# Patient Record
Sex: Male | Born: 1965
Health system: Southern US, Community
[De-identification: ages and names within clinical notes are randomized; demographics above are authoritative.]

## PROBLEM LIST (undated history)

## (undated) DIAGNOSIS — R0789 Other chest pain: Secondary | ICD-10-CM

## (undated) DIAGNOSIS — F419 Anxiety disorder, unspecified: Secondary | ICD-10-CM

## (undated) DIAGNOSIS — N200 Calculus of kidney: Secondary | ICD-10-CM

## (undated) DIAGNOSIS — R7989 Other specified abnormal findings of blood chemistry: Secondary | ICD-10-CM

## (undated) DIAGNOSIS — R05 Cough: Secondary | ICD-10-CM

## (undated) HISTORY — DX: Calculus of kidney: N20.0

## (undated) HISTORY — PX: COLONOSCOPY: SHX174

## (undated) HISTORY — DX: Other specified abnormal findings of blood chemistry: R79.89

## (undated) HISTORY — DX: Other chest pain: R07.89

## (undated) HISTORY — DX: Anxiety disorder, unspecified: F41.9

---
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
## 2010-12-04 ENCOUNTER — Emergency Department (INDEPENDENT_AMBULATORY_CARE_PROVIDER_SITE_OTHER): Payer: BC Managed Care – PPO

## 2010-12-04 ENCOUNTER — Emergency Department (HOSPITAL_BASED_OUTPATIENT_CLINIC_OR_DEPARTMENT_OTHER)
Admission: EM | Admit: 2010-12-04 | Discharge: 2010-12-04 | Disposition: A | Discharge status: Home / Self Care | Payer: BC Managed Care – PPO | Attending: Emergency Medicine | Admitting: Emergency Medicine

## 2010-12-04 DIAGNOSIS — W2209XA Striking against other stationary object, initial encounter: Secondary | ICD-10-CM | POA: Insufficient documentation

## 2010-12-04 DIAGNOSIS — S0003XA Contusion of scalp, initial encounter: Secondary | ICD-10-CM

## 2010-12-04 DIAGNOSIS — F17200 Nicotine dependence, unspecified, uncomplicated: Secondary | ICD-10-CM | POA: Insufficient documentation

## 2010-12-04 DIAGNOSIS — S0083XA Contusion of other part of head, initial encounter: Secondary | ICD-10-CM | POA: Insufficient documentation

## 2017-05-30 DIAGNOSIS — N200 Calculus of kidney: Secondary | ICD-10-CM

## 2017-05-30 HISTORY — DX: Calculus of kidney: N20.0

## 2017-06-07 ENCOUNTER — Ambulatory Visit: Payer: BLUE CROSS/BLUE SHIELD | Admitting: Family Medicine

## 2017-06-07 ENCOUNTER — Encounter: Payer: Self-pay | Admitting: Family Medicine

## 2017-06-07 ENCOUNTER — Ambulatory Visit (HOSPITAL_BASED_OUTPATIENT_CLINIC_OR_DEPARTMENT_OTHER)
Admission: RE | Admit: 2017-06-07 | Discharge: 2017-06-07 | Disposition: A | Discharge status: Home / Self Care | Payer: BLUE CROSS/BLUE SHIELD | Source: Ambulatory Visit | Attending: Family Medicine | Admitting: Family Medicine

## 2017-06-07 ENCOUNTER — Other Ambulatory Visit: Payer: Self-pay

## 2017-06-07 VITALS — BP 123/76 | HR 65 | Temp 98.0°F | Resp 16 | Ht 73.5 in | Wt 214.0 lb

## 2017-06-07 DIAGNOSIS — Z87891 Personal history of nicotine dependence: Secondary | ICD-10-CM | POA: Insufficient documentation

## 2017-06-07 DIAGNOSIS — R059 Cough, unspecified: Secondary | ICD-10-CM

## 2017-06-07 DIAGNOSIS — R079 Chest pain, unspecified: Secondary | ICD-10-CM

## 2017-06-07 DIAGNOSIS — R05 Cough: Secondary | ICD-10-CM

## 2017-06-07 NOTE — Progress Notes (Signed)
Office Note 06/07/2017  CC:  Chief Complaint  Patient presents with  . Establish Care  . Scalp Itching  . Right Lung    "feels like something is in my right lung"    HPI:  Andrew Cantu is a 51 y.o.  male who is here to establish care and discuss scalp itching as well as a "lung" complaint. Patient's most recent primary MD: Dr. Margarita Grizzle at [facility].  Retired 2 yrs ago. Old records were not reviewed prior to or during today's visit.  Pt states he feels like there is something in the entrance of his right lung.  He has noticed this feeling constantly x 2 wks. No pain.  NO cough.  Says it feels deep, not on surface.  NO SOB or DOE.   When taking deep breaths like when he goes up stairs he notes a bit more out of breath easier than in the past. No hx of asthma or emphysema.  No fevers.  Some mucous comes up into back of throat when he coughs forcefully. No hemoptysis.  No abnl weight loss.  No recent URI sx's.  Swallowing does not affect this symptom.  No hoarse voice. Traveled to [place] and [place] in September.  Feels excessive stress at work lately, says it drives him to scratch his head. Is on leave from work lately, at home "waiting".    History reviewed. No pertinent past medical history.  History reviewed. No pertinent surgical history.  Family History  Problem Relation Age of Onset  . Hypertension Father     Social History   Socioeconomic History  . Marital status: Married    Spouse name: Not on file  . Number of children: Not on file  . Years of education: Not on file  . Highest education level: Not on file  Social Needs  . Financial resource strain: Not on file  . Food insecurity - worry: Not on file  . Food insecurity - inability: Not on file  . Transportation needs - medical: Not on file  . Transportation needs - non-medical: Not on file  Occupational History  . Not on file  Tobacco Use  . Smoking status: Former Smoker    Packs/day: 0.50    Years:  30.00    Pack years: 15.00    Types: Cigarettes    Last attempt to quit: 02/27/2017    Years since quitting: 0.2  . Smokeless tobacco: Never Used  Substance and Sexual Activity  . Alcohol use: Yes    Alcohol/week: 1.8 - 2.4 oz    Types: 3 - 4 Glasses of wine per week  . Drug use: No  . Sexual activity: Not on file  Other Topics Concern  . Not on file  Social History Narrative   [name] daughter (adult).   Educ: Masters degree (BA--[employer] and also in Engineer, production).   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   Tob: 30 pack year hx.  Quit 2018.   Alc: occ wine   Orig from [place].    MEDS: none  No Known Allergies  ROS Review of Systems  Constitutional: Negative for appetite change, chills, fatigue and fever.  HENT: Negative for congestion, dental problem, ear pain and sore throat.   Eyes: Negative for discharge, redness and visual disturbance.  Respiratory: Negative for cough, chest tightness, shortness of breath and wheezing.   Cardiovascular: Negative for chest pain, palpitations and leg swelling.  Gastrointestinal: Negative for abdominal pain, blood in stool, diarrhea, nausea and vomiting.  Genitourinary: Negative for difficulty urinating, dysuria, flank pain, frequency, hematuria and urgency.  Musculoskeletal: Negative for arthralgias, back pain, joint swelling, myalgias and neck stiffness.  Skin: Negative for pallor and rash.  Neurological: Negative for dizziness, speech difficulty, weakness and headaches.  Hematological: Negative for adenopathy. Does not bruise/bleed easily.  Psychiatric/Behavioral: Negative for confusion and sleep disturbance. The patient is nervous/anxious (lots of stress at work recently.).     PE; Blood pressure 123/76, pulse 65, temperature 98 F (36.7 C), temperature source Oral, resp. rate 16, height 6' 1.5" (1.867 m), weight 214 lb (97.1 kg), SpO2 94 %. Gen: Alert, well appearing.  Patient is oriented to person, place, time, and  situation. AFFECT: pleasant, lucid thought and speech. Head: scalp and hair are normal MWN:UUVO: no injection, icteris, swelling, or exudate.  EOMI, PERRLA. Mouth: lips without lesion/swelling.  Oral mucosa pink and moist. Oropharynx without erythema, exudate, or swelling.  CV: RRR, no m/r/g.   LUNGS: CTA bilat, nonlabored resps, good aeration in all lung fields. No chest wall deformity or tenderness or rash. No c/c/e.  Pertinent labs:  none  ASSESSMENT AND PLAN:   New pt; will attempt to obtain prior PCP records.  1) Itchy scalp: he is scratching b/c of nervous habit and this is inducing scratch/itch cycle. 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  2) Right chest abnormal sensation: pt feels like something is "in the entrance to my lung"---exam normal. Pt is ex-smoker, just quit this year. Will do CBC w/diff, CMET, and CXR.  If all normal will take a watchful waiting approach.  An After Visit Summary was printed and given to the patient.  Return for set up fasting CPE in 1 mo.  Signed:  Crissie Sickles, MD           06/07/2017

## 2017-06-08 LAB — CBC WITH DIFFERENTIAL/PLATELET
BASOS ABS: 42 {cells}/uL (ref 0–200)
BASOS PCT: 0.9 %
EOS PCT: 1.3 %
Eosinophils Absolute: 61 cells/uL (ref 15–500)
HCT: 39.5 % (ref 38.5–50.0)
HEMOGLOBIN: 13.8 g/dL (ref 13.2–17.1)
Lymphs Abs: 1415 cells/uL (ref 850–3900)
MCH: 30.1 pg (ref 27.0–33.0)
MCHC: 34.9 g/dL (ref 32.0–36.0)
MCV: 86.2 fL (ref 80.0–100.0)
MPV: 10.5 fL (ref 7.5–12.5)
Monocytes Relative: 7.7 %
NEUTROS ABS: 2820 {cells}/uL (ref 1500–7800)
Neutrophils Relative %: 60 %
Platelets: 300 10*3/uL (ref 140–400)
RBC: 4.58 10*6/uL (ref 4.20–5.80)
RDW: 12.5 % (ref 11.0–15.0)
Total Lymphocyte: 30.1 %
WBC mixed population: 362 cells/uL (ref 200–950)
WBC: 4.7 10*3/uL (ref 3.8–10.8)

## 2017-06-08 LAB — COMPREHENSIVE METABOLIC PANEL
AG RATIO: 1.8 (calc) (ref 1.0–2.5)
ALT: 19 U/L (ref 9–46)
AST: 16 U/L (ref 10–40)
Albumin: 4.6 g/dL (ref 3.6–5.1)
Alkaline phosphatase (APISO): 58 U/L (ref 40–115)
BUN / CREAT RATIO: 9 (calc) (ref 6–22)
BUN: 16 mg/dL (ref 7–25)
CO2: 25 mmol/L (ref 20–32)
Calcium: 9.5 mg/dL (ref 8.6–10.3)
Chloride: 106 mmol/L (ref 98–110)
Creat: 1.88 mg/dL — ABNORMAL HIGH (ref 0.60–1.35)
GLUCOSE: 97 mg/dL (ref 65–99)
Globulin: 2.5 g/dL (calc) (ref 1.9–3.7)
POTASSIUM: 4.3 mmol/L (ref 3.5–5.3)
Sodium: 140 mmol/L (ref 135–146)
TOTAL PROTEIN: 7.1 g/dL (ref 6.1–8.1)
Total Bilirubin: 0.9 mg/dL (ref 0.2–1.2)

## 2017-06-08 LAB — SEDIMENTATION RATE: Sed Rate: 6 mm/h (ref 0–15)

## 2017-06-09 ENCOUNTER — Encounter: Payer: Self-pay | Admitting: Family Medicine

## 2017-06-09 ENCOUNTER — Other Ambulatory Visit: Payer: Self-pay | Admitting: Family Medicine

## 2017-06-09 DIAGNOSIS — R7989 Other specified abnormal findings of blood chemistry: Secondary | ICD-10-CM

## 2017-06-10 ENCOUNTER — Ambulatory Visit (HOSPITAL_BASED_OUTPATIENT_CLINIC_OR_DEPARTMENT_OTHER)
Admission: RE | Admit: 2017-06-10 | Discharge: 2017-06-10 | Disposition: A | Discharge status: Home / Self Care | Payer: BLUE CROSS/BLUE SHIELD | Source: Ambulatory Visit | Attending: Family Medicine | Admitting: Family Medicine

## 2017-06-10 DIAGNOSIS — R93421 Abnormal radiologic findings on diagnostic imaging of right kidney: Secondary | ICD-10-CM | POA: Insufficient documentation

## 2017-06-10 DIAGNOSIS — R7989 Other specified abnormal findings of blood chemistry: Secondary | ICD-10-CM | POA: Insufficient documentation

## 2017-06-12 ENCOUNTER — Other Ambulatory Visit (INDEPENDENT_AMBULATORY_CARE_PROVIDER_SITE_OTHER): Payer: BLUE CROSS/BLUE SHIELD

## 2017-06-12 DIAGNOSIS — R7989 Other specified abnormal findings of blood chemistry: Secondary | ICD-10-CM

## 2017-06-12 LAB — URINALYSIS, ROUTINE W REFLEX MICROSCOPIC
BILIRUBIN URINE: NEGATIVE
Hgb urine dipstick: NEGATIVE
Ketones, ur: NEGATIVE
LEUKOCYTES UA: NEGATIVE
Nitrite: NEGATIVE
PH: 5.5 (ref 5.0–8.0)
RBC / HPF: NONE SEEN (ref 0–?)
Specific Gravity, Urine: >=1.03 — AB (ref 1.000–1.030)
Total Protein, Urine: NEGATIVE
URINE GLUCOSE: NEGATIVE
UROBILINOGEN UA: 0.2 (ref 0.0–1.0)

## 2017-06-12 LAB — BASIC METABOLIC PANEL
BUN: 16 mg/dL (ref 6–23)
CALCIUM: 9.5 mg/dL (ref 8.4–10.5)
CO2: 28 mEq/L (ref 19–32)
Chloride: 106 mEq/L (ref 96–112)
Creatinine, Ser: 0.99 mg/dL (ref 0.40–1.50)
GFR: 85.36 mL/min (ref >60.00)
GLUCOSE: 110 mg/dL — AB (ref 70–99)
Potassium: 4.8 mEq/L (ref 3.5–5.1)
SODIUM: 141 meq/L (ref 135–145)

## 2017-06-12 LAB — MICROALBUMIN / CREATININE URINE RATIO
Creatinine,U: 259.1 mg/dL
MICROALB UR: 1.1 mg/dL (ref 0.0–1.9)
MICROALB/CREAT RATIO: 0.4 mg/g (ref 0.0–30.0)

## 2017-06-14 ENCOUNTER — Encounter: Payer: Self-pay | Admitting: Family Medicine

## 2017-07-04 ENCOUNTER — Encounter: Payer: BLUE CROSS/BLUE SHIELD | Admitting: Family Medicine

## 2017-07-12 ENCOUNTER — Encounter: Payer: BLUE CROSS/BLUE SHIELD | Admitting: Family Medicine

## 2017-11-07 ENCOUNTER — Encounter: Payer: BLUE CROSS/BLUE SHIELD | Admitting: Family Medicine

## 2017-11-07 NOTE — Progress Notes (Deleted)
Office Note 11/07/2017  CC: No chief complaint on file.   HPI:  Andrew Cantu is a 52 y.o.  male who is here for annual health maintenance exam.   Past Medical History:  Diagnosis Date  . Elevated serum creatinine    Cr 1.8 (eGFR 43).  Normal on f/u testing.  . Kidney stone 05/2017   Asymptomatic 8 mm nonobstructing stone in midportion of R kidney (stone vs small angiomyolipoma).  Plan--active surveillance with repeat renal u/s around 11/2017.    No past surgical history on file.  Family History  Problem Relation Age of Onset  . Hypertension Father     Social History   Socioeconomic History  . Marital status: Married    Spouse name: Not on file  . Number of children: Not on file  . Years of education: Not on file  . Highest education level: Not on file  Occupational History  . Not on file  Social Needs  . Financial resource strain: Not on file  . Food insecurity:    Worry: Not on file    Inability: Not on file  . Transportation needs:    Medical: Not on file    Non-medical: Not on file  Tobacco Use  . Smoking status: Former Smoker    Packs/day: 0.50    Years: 30.00    Pack years: 15.00    Types: Cigarettes    Last attempt to quit: 02/27/2017    Years since quitting: 0.6  . Smokeless tobacco: Never Used  Substance and Sexual Activity  . Alcohol use: Yes    Alcohol/week: 1.8 - 2.4 oz    Types: 3 - 4 Glasses of wine per week  . Drug use: No  . Sexual activity: Not on file  Lifestyle  . Physical activity:    Days per week: Not on file    Minutes per session: Not on file  . Stress: Not on file  Relationships  . Social connections:    Talks on phone: Not on file    Gets together: Not on file    Attends religious service: Not on file    Active member of club or organization: Not on file    Attends meetings of clubs or organizations: Not on file    Relationship status: Not on file  . Intimate partner violence:    Fear of current or ex partner: Not on  file    Emotionally abused: Not on file    Physically abused: Not on file    Forced sexual activity: Not on file  Other Topics Concern  . Not on file  Social History Narrative   Andrew Cantu daughter (adult).   Educ: Masters degree (BA--[employer] and also in Engineer, production).   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   Tob: 30 pack year hx.  Quit 2018.   Alc: occ wine   Orig from [place].    MEDS:   No Known Allergies  ROS *** PE; There were no vitals taken for this visit. *** Pertinent labs:  No results found for: TSH Lab Results  Component Value Date   WBC 4.7 06/07/2017   HGB 13.8 06/07/2017   HCT 39.5 06/07/2017   MCV 86.2 06/07/2017   PLT 300 06/07/2017   Lab Results  Component Value Date   CREATININE 0.99 06/12/2017   BUN 16 06/12/2017   NA 141 06/12/2017   K 4.8 06/12/2017   CL 106 06/12/2017   CO2 28 06/12/2017   Lab Results  Component Value Date   ALT 19 06/07/2017   AST 16 06/07/2017   BILITOT 0.9 06/07/2017   No results found for: CHOL No results found for: HDL No results found for: LDLCALC No results found for: TRIG No results found for: CHOLHDL No results found for: PSA  No results found for: HGBA1C   ASSESSMENT AND PLAN:   Health maintenance exam: Reviewed age and gender appropriate health maintenance issues (prudent diet, regular exercise, health risks of tobacco and excessive alcohol, use of seatbelts, fire alarms in home, use of sunscreen).  Also reviewed age and gender appropriate health screening as well as vaccine recommendations. Vaccines: Labs: Prostate ca screening: average risk patient= start screening at age 46 yrs. Colon ca screening: average risk patient= start screening at age 68 yrs.  Stone vs angiomyolipoma in R kidney on ultrasound--arrange f/u ultrasound.  An After Visit Summary was printed and given to the patient.  FOLLOW UP:  No follow-ups on file.  Signed:  Crissie Sickles, MD           11/07/2017

## 2017-11-18 ENCOUNTER — Telehealth: Payer: Self-pay

## 2017-11-18 ENCOUNTER — Encounter: Payer: Self-pay | Admitting: Family Medicine

## 2017-11-18 NOTE — Telephone Encounter (Signed)
Noted  

## 2017-11-18 NOTE — Telephone Encounter (Signed)
Called patient to discuss chest pain (pt scheduled mychart visit for Friday, 11/22/17). Patient states he is not having chest pain, however he "senses something in my right lung". Patient denies CP or SOB, reports occasional mucus production. Requesting a follow up regarding his CXR from previous visit. Advised patient if he begins to experience CP or SOB to go to the Emergency Department. Plans to see PCP on Friday.

## 2017-11-22 ENCOUNTER — Encounter: Payer: Self-pay | Admitting: Family Medicine

## 2017-11-22 ENCOUNTER — Ambulatory Visit: Payer: 59 | Admitting: Family Medicine

## 2017-11-22 VITALS — BP 120/75 | HR 66 | Temp 97.9°F | Resp 16 | Ht 73.5 in | Wt 209.0 lb

## 2017-11-22 DIAGNOSIS — R0789 Other chest pain: Secondary | ICD-10-CM | POA: Diagnosis not present

## 2017-11-22 NOTE — Progress Notes (Signed)
OFFICE VISIT  11/22/2017   CC:  Chief Complaint  Patient presents with  . Itching Scalp  . Lung    feels something in his right lung   HPI:    Patient is a 52 y.o.  male who presents for chest complaint. His ability to describe his symptom is poor, perhaps due to cultural or language differences. Says "I'm concerned about my right lung".  Describes very vague feeling in R upper lung region.  Question of feeling of mucous rattling in R upper lung region anteriorly.  Clears throat forcefully but doesn't get mucous up into mouth.   No significant cough though.  Also says that intermittently he feels the sensation that he doesn't aerate the R lung as well as the L lung when he takes a deep breath. NO pain in chest.  No wheezing.  No SOB or DOE.  He walks a lot during the day but no running.   No sneezing, congested nose, runny nose, ST, or HA.  No abnormal wt loss, no fevers.  Denies GERD.   No abd pain, no n/v.  He reminds me he was a smoker for 30 yrs, quit last year, THEN started feeling this symptom. He admits there may be a bit of psychological "fear/worry" playing a role here.  I saw him 06/07/17 for the exact same complaint. CXR, CBC, and ESR were normal at that time, watchful waiting approach decided upon.  Itchy scalp, feels like there is something wrong with the skin on scalp.  Past Medical History:  Diagnosis Date  . Elevated serum creatinine    Cr 1.8 (eGFR 43).  Normal on f/u testing.  . Kidney stone 05/2017   Asymptomatic 8 mm nonobstructing stone in midportion of R kidney (stone vs small angiomyolipoma).  Plan--active surveillance with repeat renal u/s around 11/2017.    History reviewed. No pertinent surgical history.  MEDS: none  No Known Allergies  ROS As per HPI  PE: Blood pressure 120/75, pulse 66, temperature 97.9 F (36.6 C), temperature source Oral, resp. rate 16, height 6' 1.5" (1.867 m), weight 209 lb (94.8 kg), SpO2 97 %. Gen: Alert, well  appearing.  Patient is oriented to person, place, time, and situation. AFFECT: pleasant, lucid thought and speech. Scalp: no flaking, lesions, hair loss, or erythema. ENT: Ears: EACs clear, normal epithelium.  TMs with good light reflex and landmarks bilaterally.  Eyes: no injection, icteris, swelling, or exudate.  EOMI, PERRLA. Nose: no drainage or turbinate edema/swelling.  No injection or focal lesion.  Mouth: lips without lesion/swelling.  Oral mucosa pink and moist.  Dentition intact and without obvious caries or gingival swelling.  Oropharynx without erythema, exudate, or swelling.  Neck - No masses or thyromegaly or limitation in range of motion CV: RRR, no m/r/g.   LUNGS: CTA bilat, nonlabored resps, good aeration in all lung fields. No chest wall TTP or deformity. ABd: soft, NT/ND EXT: no clubbing, cyanosis, or edema.    LABS:    Chemistry      Component Value Date/Time   NA 141 06/12/2017 0804   K 4.8 06/12/2017 0804   CL 106 06/12/2017 0804   CO2 28 06/12/2017 0804   BUN 16 06/12/2017 0804   CREATININE 0.99 06/12/2017 0804   CREATININE 1.88 (H) 06/07/2017 1512      Component Value Date/Time   CALCIUM 9.5 06/12/2017 0804   AST 16 06/07/2017 1512   ALT 19 06/07/2017 1512   BILITOT 0.9 06/07/2017 1512  Lab Results  Component Value Date   WBC 4.7 06/07/2017   HGB 13.8 06/07/2017   HCT 39.5 06/07/2017   MCV 86.2 06/07/2017   PLT 300 06/07/2017   Lab Results  Component Value Date   ESRSEDRATE 6 06/07/2017   IMPRESSION AND PLAN:  1) Right sided chest discomfort--vague.  Not pain. Symptoms seem to be that he doesn't feel like a focal area of R lung anteriorly is normal--"feels like something is in there". High anxiety level, which he acknowledges may be playing a role here. CXR at the time of my initial eval for this problem was normal. No red flags to point me towards further w/u, but I would like to get a pulmonologist to see him to see if I'm missing  something. Reassured pt today.  Spent 25 min with pt today, with >50% of this time spent in counseling and care coordination regarding the above problems.  An After Visit Summary was printed and given to the patient.  FOLLOW UP: Return for as needed.  Signed:  Crissie Sickles, MD           11/22/2017

## 2017-12-05 ENCOUNTER — Encounter: Payer: Self-pay | Admitting: Family Medicine

## 2018-03-07 ENCOUNTER — Encounter: Payer: Self-pay | Admitting: Family Medicine

## 2018-03-07 NOTE — Telephone Encounter (Signed)
Called pt to find out why he would like to see a dermatologist. So that we could associate a Dx. Pt stated that it was personal and he did not feel comfortable telling me why. I advised pt that Dr. Anitra Lauth was out of the office today (03/07/18) and Monday (03/10/18) and wouldn't be back in the office till Tuesday. Pt then stated that "that was not acceptable and was to long of a wait". I offered to schedule pt to see one of our other PCP, pt stated that he did not want to see a PCP he wanted to see a dermatologist. I advised pt that he could try to schedule his own apt with dermatology, he stated that he already tried that and they told him he needed a referral. I advised pt again that Dr. Anitra Lauth would not be able to address this until Tuesday, pt again stated that he could not wait that long. I advised pt that I did not know how else to help him. He then said good-bye and hung up the phone.

## 2018-03-07 NOTE — Telephone Encounter (Signed)
I spoke with the patient at length. He stated multiple times he was uncomfortable sharing his condition with anyone other than his physician. He was upset because her thought the previous call was from a Network engineer. I explained that Nira Conn was a CMA and this was very routine. He said he felt like his chart should only be accessible by his physician.   He said he was recently evaluated by Dr. Anitra Lauth for a skin condition on his scalp. Upon further investigation, it was determined the last time this was evaluated was in November 2018, not recently. I reiterated again that I would be happy to share this information with Dr. Anitra Lauth and he can determine if a referral can be placed without seeing the patient. I also advised that due to the length of time passed he may need an additional office visit to discuss possible treatments.   Patient stated numerous times this is a money racket and he demands an urgent referral. He does not want to wait months to see a dermatologist. I let him know that most dermatologists are scheduling out to November for urgent referrals. Patient asked again who could see his my chart messages. I let him know that anyone that was providing care to him had access to his chart. He said that this was ridiculous and only his primary care doctor should be able to see any information in his chart. He asked that I make sure this message gets to Dr. Anitra Lauth. He refused to make an appointment at this time.

## 2018-03-11 NOTE — Telephone Encounter (Signed)
Pt needs to be evaluated by me before referral can be made.   However his demeanor sounded very rude, not to mention demanding, which is certainly uncalled for. He needs to understand that our provision of medical care does not mean he can just say anything he wants to [place]. Pls let me know what you think about this.

## 2018-03-12 ENCOUNTER — Telehealth: Payer: Self-pay | Admitting: Family Medicine

## 2018-03-12 NOTE — Telephone Encounter (Signed)
Pt states that he made his own referral to a derm and based on this experience he will find a new PCP. I will remove you as PCP.

## 2018-03-12 NOTE — Telephone Encounter (Signed)
This is fine.-thx 

## 2018-09-09 ENCOUNTER — Ambulatory Visit: Payer: Self-pay

## 2018-09-09 ENCOUNTER — Encounter: Payer: Self-pay | Admitting: Family Medicine

## 2018-09-09 NOTE — Telephone Encounter (Signed)
FYI

## 2018-09-09 NOTE — Telephone Encounter (Signed)
More Detail >>  FW: Appointment scheduled from [place], [place], [place]  Sent: Tue September 09, 2018 7:00 AM  To: [facility] Nurse Triage Yaziel Brandon Hollen  MRN: [id number] DOB: 1965/10/29  Pt Home: [phone]    Entered: [phone]      Message   Linus Orn, RN, will be contacting this patient.   ----- Message -----  From: Andrew Cantu  Sent: 09/08/2018  7:07 PM EST  To: Madelyn Brunner Pool  Subject: Appointment scheduled from [place]          Appointment For: Andrew Cantu ([id number])  Visit Type: MYCHART OFFICE VISIT (1064)    09/18/2018  8:45 AM 30 mins. McGowen, Adrian Blackwater, MD   [facility]    Patient Comments:  Chest pain    Pt. Reports he started having right-sided chest "discomfort - not really pain 1 year ago - I saw Dr. Ernestine Conrad for this before."  Hurts in one area and does not radiate. No nausea,sweating. Some shortness of breath with exertion - has noticed a difference at the gym. Discomfort is now constant. No other symptoms. Appointment made for Friday. Instructed if symptoms worsened to call back or go to ED. Verbalizes understanding. Reason for Disposition . [1] Chest pain lasting <= 5 minutes AND [2] NO chest pain or cardiac symptoms now(Exceptions: pains lasting a few seconds)  Answer Assessment - Initial Assessment Questions 1. LOCATION: "Where does it hurt?"       Right side of the middle 2. RADIATION: "Does the pain go anywhere else?" (e.g., into neck, jaw, arms, back)     No 3. ONSET: "When did the chest pain begin?" (Minutes, hours or days)      Started 1 year ago 4. PATTERN "Does the pain come and go, or has it been constant since it started?"  "Does it get worse with exertion?"      More constant 5. DURATION: "How long does it last" (e.g., seconds, minutes, hours)     Constant 6. SEVERITY: "How bad is the pain?"  (e.g., Scale 1-10; mild, moderate, or severe)    - MILD (1-3): doesn't interfere with normal activities     - MODERATE  (4-7): interferes with normal activities or awakens from sleep    - SEVERE (8-10): excruciating pain, unable to do any normal activities         3 7. CARDIAC RISK FACTORS: "Do you have any history of heart problems or risk factors for heart disease?" (e.g., prior heart attack, angina; high blood pressure, diabetes, being overweight, high cholesterol, smoking, or strong family history of heart disease)     No 8. PULMONARY RISK FACTORS: "Do you have any history of lung disease?"  (e.g., blood clots in lung, asthma, emphysema, birth control pills)     No 9. CAUSE: "What do you think is causing the chest pain?"     Unsure 10. OTHER SYMPTOMS: "Do you have any other symptoms?" (e.g., dizziness, nausea, vomiting, sweating, fever, difficulty breathing, cough)       Some shortness of breath 11. PREGNANCY: "Is there any chance you are pregnant?" "When was your last menstrual period?"       n/a  Protocols used: CHEST PAIN-A-AH

## 2018-09-09 NOTE — Telephone Encounter (Signed)
OK. Pt saw Dr. Jarome Lamas at [facility] on 12/05/17 and the plan dictated on the pulm note from that day says "chest CT to be done".  Pls see that office has record of a chest Ct ever getting done on this patient.-thx

## 2018-09-09 NOTE — Telephone Encounter (Signed)
Request for record has been sent.

## 2018-09-11 ENCOUNTER — Encounter: Payer: Self-pay | Admitting: *Deleted

## 2018-09-11 NOTE — Telephone Encounter (Signed)
SW Jessica Dr. Jarome Lamas assistant and she stated that pt no showed his apt for chest CT.

## 2018-09-11 NOTE — Telephone Encounter (Signed)
Noted  

## 2018-09-12 ENCOUNTER — Ambulatory Visit: Payer: BC Managed Care – PPO | Admitting: Family Medicine

## 2018-09-12 ENCOUNTER — Encounter: Payer: Self-pay | Admitting: Family Medicine

## 2018-09-12 VITALS — BP 118/82 | HR 80 | Temp 98.3°F | Resp 16 | Ht 73.5 in | Wt 222.6 lb

## 2018-09-12 DIAGNOSIS — R0789 Other chest pain: Secondary | ICD-10-CM

## 2018-09-12 DIAGNOSIS — F419 Anxiety disorder, unspecified: Secondary | ICD-10-CM | POA: Diagnosis not present

## 2018-09-12 NOTE — Progress Notes (Signed)
OFFICE VISIT  09/12/2018   CC:  Chief Complaint  Patient presents with  . Chest wall, discomfort   HPI:    Patient is a 53 y.o. Caucasian male who presents for ongoing R sided chest discomfort that he finds hard to describe other than "I know I feel something in my right lung".  I saw him for this about a year ago and was unable to adequately reassure him that all was ok so I asked pulm to see him and pulm had same impression as I did. Has sensation of "mucous" in R side.  Says he feels like the R lung doesn't aerate as well as L.  He denies cough, hemoptysis, wt loss, wheezing, SOB, or chest tightness.  He does CV/aerobic exercise and has no SOB or CP or dizziness or jaw pain or arm pain or palpitations. He smoked 1/2 pack cigs/day for a few years in the remote past and his father was a smoker and died in his 84s of "possibly lung cancer, but no diagnosis was ever done to prove this". Pt is very unusually afraid, almost obsessed with the possibility of having lung cancer.  (He saw pulmonologist last year and pulm did not find anything significantly wrong and pt was extremely anxous about "possibly having lung cancer" so pulm ordered CT chest BUT b/c of insurance coverage problems pt did not get the CT  No fevers, no abnl wt loss, no night sweats, no fatigue, no hemoptysis.  Past Medical History:  Diagnosis Date  . Anxiety    particularly about his health  . Discomfort in chest 2018/2019   R side: pt says he feels like there is something in his R lung.  He cannot be reassured that he is ok despite normal ESR, spirometry, and CXR-->therefore, Pulmonologist has ordered CT chest as of 12/05/17.  Marland Kitchen Elevated serum creatinine    Cr 1.8 (eGFR 43).  Normal on f/u testing.  . Kidney stone 05/2017   Asymptomatic 8 mm nonobstructing stone in midportion of R kidney (stone vs small angiomyolipoma).  Plan--active surveillance with repeat renal u/s around 11/2017.    History reviewed. No pertinent  surgical history.  No outpatient medications prior to visit.   No facility-administered medications prior to visit.     No Known Allergies  ROS As per HPI  PE: Blood pressure 118/82, pulse 80, temperature 98.3 F (36.8 C), temperature source Oral, resp. rate 16, height 6' 1.5" (1.867 m), weight 222 lb 9.6 oz (101 kg), SpO2 94 %. Body mass index is 28.97 kg/m.  Gen: Alert, well appearing.  Patient is oriented to person, place, time, and situation. AFFECT: pleasant, lucid thought and speech. IEP:PIRJ: no injection, icteris, swelling, or exudate.  EOMI, PERRLA. Mouth: lips without lesion/swelling.  Oral mucosa pink and moist. Oropharynx without erythema, exudate, or swelling.  Neck - No masses or thyromegaly or limitation in range of motion CV: RRR, no m/r/g.   No tenderness to palpation.  No chest wall deformity. LUNGS: CTA bilat, nonlabored resps, good aeration in all lung fields. ABD: soft, NT, ND. EXT: no clubbing or cyanosis.  no edema.   LABS:   Lab Results  Component Value Date   WBC 4.7 06/07/2017   HGB 13.8 06/07/2017   HCT 39.5 06/07/2017   MCV 86.2 06/07/2017   PLT 300 06/07/2017   Lab Results  Component Value Date   CREATININE 0.99 06/12/2017   BUN 16 06/12/2017   NA 141 06/12/2017   K 4.8 06/12/2017  CL 106 06/12/2017   CO2 28 06/12/2017   Lab Results  Component Value Date   ALT 19 06/07/2017   AST 16 06/07/2017   BILITOT 0.9 06/07/2017    IMPRESSION AND PLAN:  Vague R sided chest discomfort, possibly chest wall etiology. No sign of any lung problem or CV problem. Will go ahead and check CXR today and possibly annually while he has this symptom. If any change in sx's or abnormality on CXR, next step would be chest CT or ask pulm to see him again.    Spent 30 min with pt today, with >50% of this time spent in counseling and care coordination regarding the above problems.  An After Visit Summary was printed and given to the patient.  FOLLOW UP:  Return in about 6 months (around 03/13/2019) for annual CPE (fasting).  Signed:  Crissie Sickles, MD           09/12/2018

## 2018-09-15 ENCOUNTER — Ambulatory Visit (HOSPITAL_BASED_OUTPATIENT_CLINIC_OR_DEPARTMENT_OTHER)
Admission: RE | Admit: 2018-09-15 | Discharge: 2018-09-15 | Disposition: A | Discharge status: Home / Self Care | Payer: BC Managed Care – PPO | Source: Ambulatory Visit | Attending: Family Medicine | Admitting: Family Medicine

## 2018-09-15 DIAGNOSIS — R0789 Other chest pain: Secondary | ICD-10-CM | POA: Insufficient documentation

## 2018-09-18 ENCOUNTER — Ambulatory Visit: Payer: 59 | Admitting: Family Medicine

## 2018-09-24 ENCOUNTER — Encounter: Payer: 59 | Admitting: Family Medicine

## 2018-09-25 IMAGING — DX DG CHEST 2V
2 series · 2 of 2 positions shown · non-contrast
Comparison: None.

CLINICAL DATA: Right-sided chest pain for 4 weeks.  Cough.

EXAM:
CHEST  2 VIEW

[chest pa]
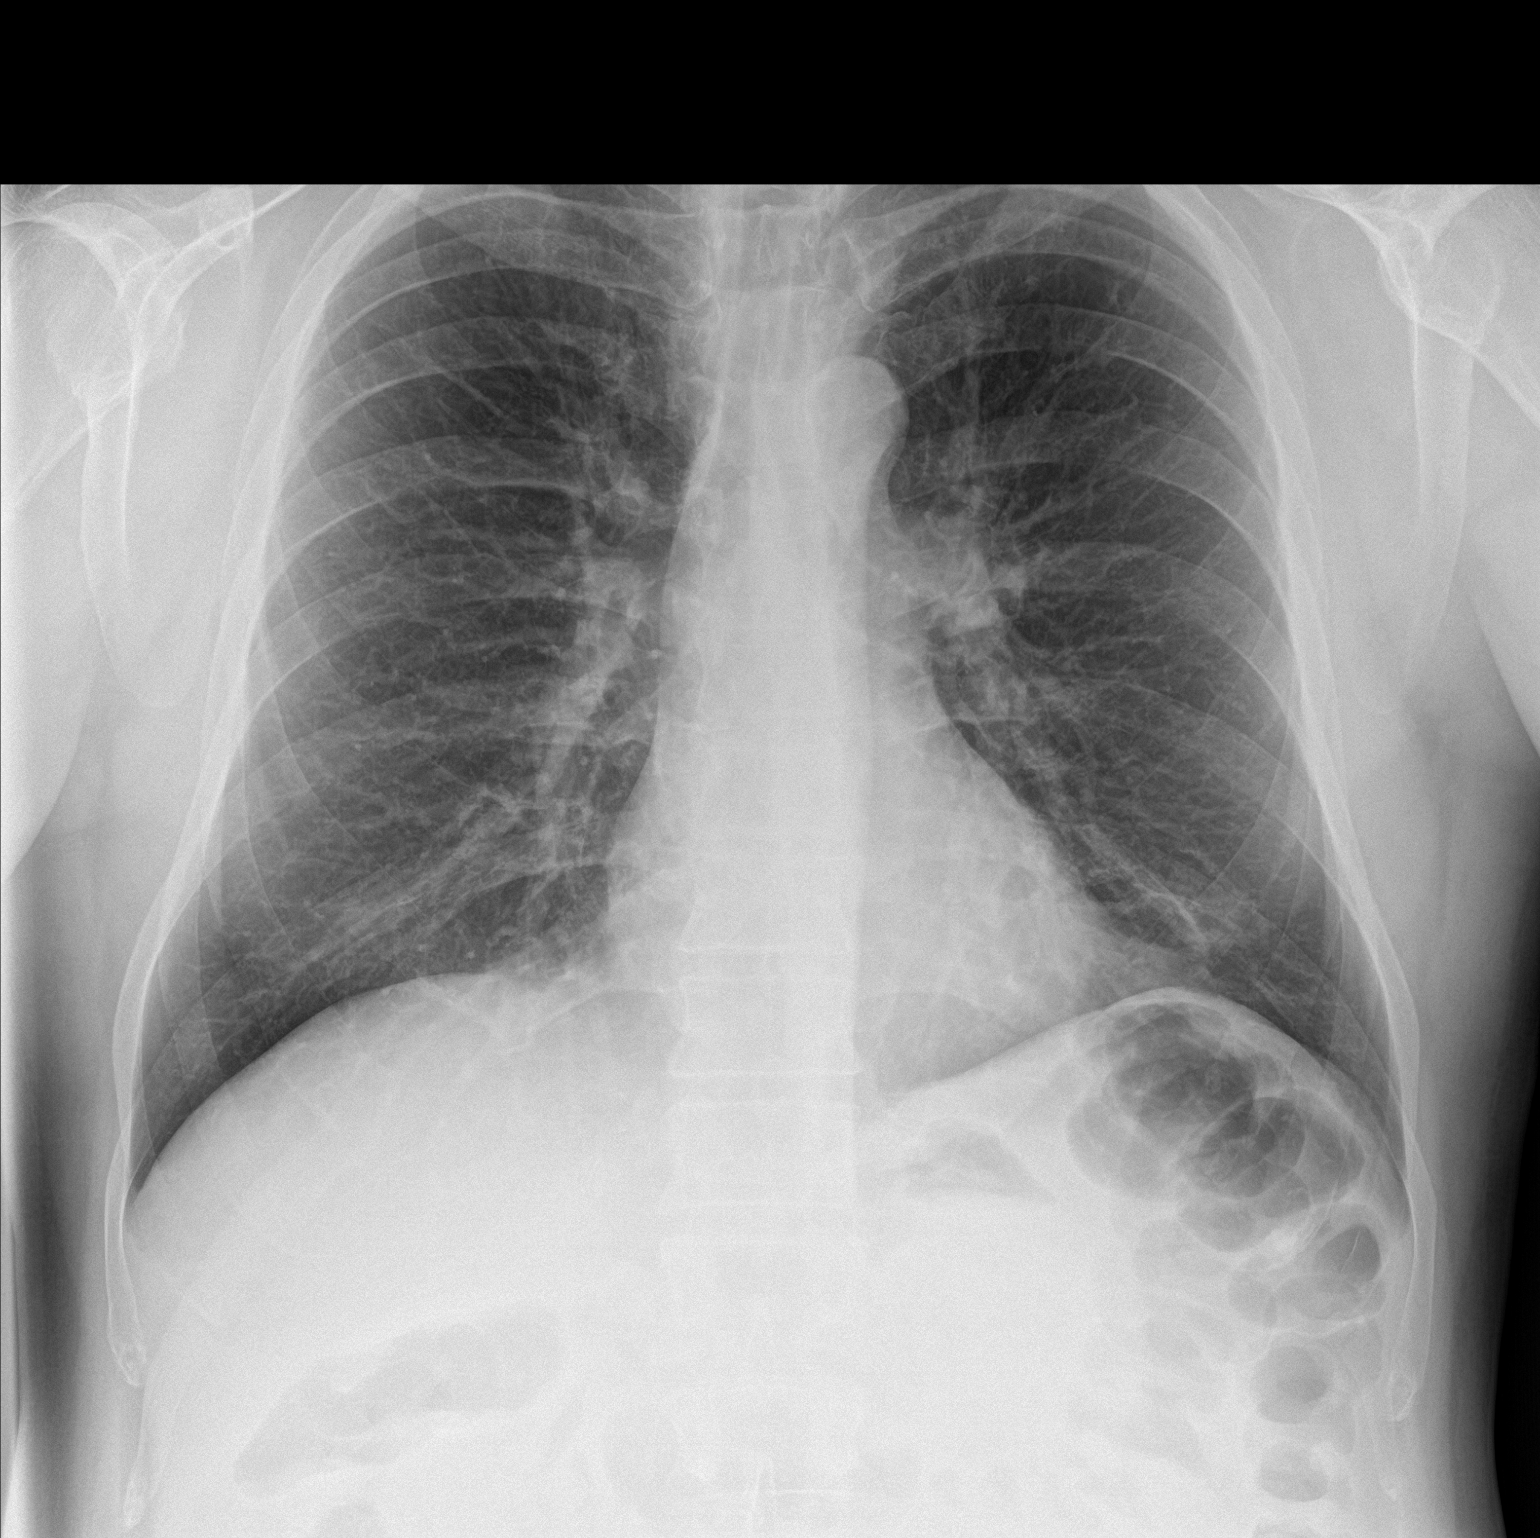

[chest lat]
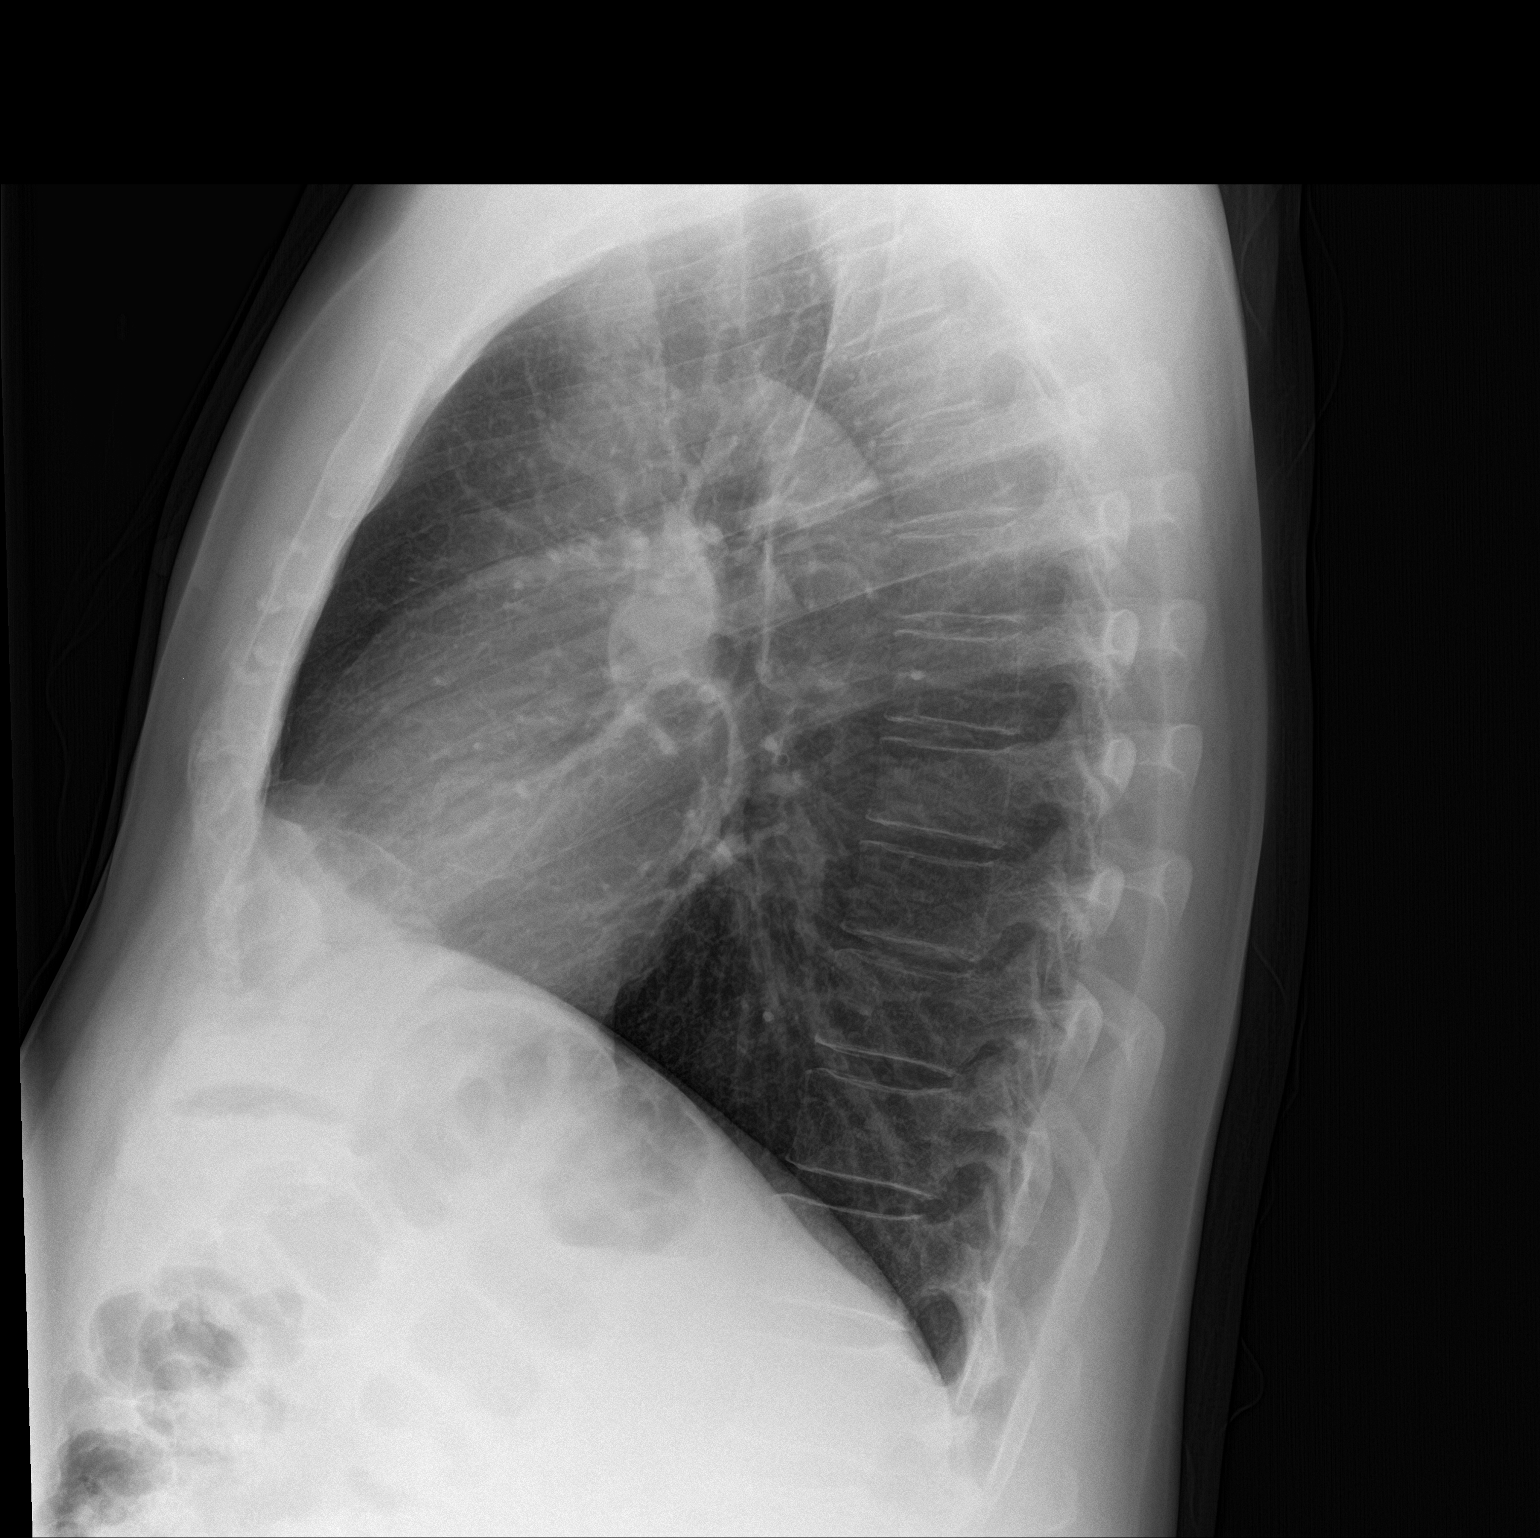

[2 of 2 positions shown; findings below may reference images not displayed]

FINDINGS: The heart size and mediastinal contours are within normal limits.
Both lungs are clear. The visualized skeletal structures are
unremarkable.
IMPRESSION: No active cardiopulmonary disease.

## 2019-03-13 ENCOUNTER — Encounter: Payer: BC Managed Care – PPO | Admitting: Family Medicine

## 2019-04-02 ENCOUNTER — Ambulatory Visit (INDEPENDENT_AMBULATORY_CARE_PROVIDER_SITE_OTHER): Payer: BC Managed Care – PPO | Admitting: Family Medicine

## 2019-04-02 ENCOUNTER — Encounter: Payer: Self-pay | Admitting: Family Medicine

## 2019-04-02 ENCOUNTER — Other Ambulatory Visit: Payer: Self-pay

## 2019-04-02 VITALS — BP 122/76 | HR 69 | Temp 98.3°F | Resp 16 | Ht 73.5 in | Wt 206.4 lb

## 2019-04-02 DIAGNOSIS — L299 Pruritus, unspecified: Secondary | ICD-10-CM

## 2019-04-02 DIAGNOSIS — Z0000 Encounter for general adult medical examination without abnormal findings: Secondary | ICD-10-CM

## 2019-04-02 DIAGNOSIS — Z125 Encounter for screening for malignant neoplasm of prostate: Secondary | ICD-10-CM | POA: Diagnosis not present

## 2019-04-02 DIAGNOSIS — E663 Overweight: Secondary | ICD-10-CM | POA: Insufficient documentation

## 2019-04-02 DIAGNOSIS — Z1211 Encounter for screening for malignant neoplasm of colon: Secondary | ICD-10-CM

## 2019-04-02 NOTE — Progress Notes (Signed)
Office Note 04/02/2019  CC:  Chief Complaint  Patient presents with  . Annual Exam    pt is not fasting    HPI:  Andrew Cantu is a 53 y.o. White male who is here for annual health maintenance exam. Working from home. Walking a lot, working on diet some, has purposefully lost 18 lbs.  He has his usual c/o feeling like he has "mucous in L lung".  No SOB, no CP, no DOE, no PND, no orthopnea.   Says scalp itches, also some itching on arms but he can't see any abnormality of scalp or skin. Admits he has nervous habit of scratching head.    Past Medical History:  Diagnosis Date  . Anxiety    particularly about his health  . Discomfort in chest 2018/2019   R side: pt says he feels like there is something in his R lung.  He cannot be reassured that he is ok despite normal ESR, spirometry, and CXR-->therefore, Pulmonologist has ordered CT chest as of 12/05/17.  Marland Kitchen Elevated serum creatinine    Cr 1.8 (eGFR 43).  Normal on f/u testing.  . Kidney stone 05/2017   Asymptomatic 8 mm nonobstructing stone in midportion of R kidney (stone vs small angiomyolipoma).  Plan--active surveillance with repeat renal u/s around 11/2017.    Past Surgical History:  Procedure Laterality Date  . COLONOSCOPY  age 9   normal (done for heme + stool -->conclusion was due to oral/dental bleeding    Family History  Problem Relation Age of Onset  . Hypertension Father     Social History   Socioeconomic History  . Marital status: Married    Spouse name: Not on file  . Number of children: Not on file  . Years of education: Not on file  . Highest education level: Not on file  Occupational History  . Not on file  Social Needs  . Financial resource strain: Not on file  . Food insecurity    Worry: Not on file    Inability: Not on file  . Transportation needs    Medical: Not on file    Non-medical: Not on file  Tobacco Use  . Smoking status: Former Smoker    Packs/day: 0.50    Years: 30.00    Pack  years: 15.00    Types: Cigarettes    Quit date: 02/27/2017    Years since quitting: 2.0  . Smokeless tobacco: Never Used  Substance and Sexual Activity  . Alcohol use: Yes    Alcohol/week: 3.0 - 4.0 standard drinks    Types: 3 - 4 Glasses of wine per week  . Drug use: No  . Sexual activity: Not on file  Lifestyle  . Physical activity    Days per week: Not on file    Minutes per session: Not on file  . Stress: Not on file  Relationships  . Social Herbalist on phone: Not on file    Gets together: Not on file    Attends religious service: Not on file    Active member of club or organization: Not on file    Attends meetings of clubs or organizations: Not on file    Relationship status: Not on file  . Intimate partner violence    Fear of current or ex partner: Not on file    Emotionally abused: Not on file    Physically abused: Not on file    Forced sexual activity: Not on file  Other Topics Concern  . Not on file  Social History Narrative   [name] daughter (adult).   Educ: Masters degree (BA--[employer] and also in Engineer, production).   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   Tob: 30 pack year hx.  Quit 2018.   Alc: occ wine   Orig from [place].    MEDS: none  No Known Allergies  ROS Review of Systems  Constitutional: Negative for appetite change, chills, fatigue and fever.  HENT: Negative for congestion, dental problem, ear pain and sore throat.   Eyes: Negative for discharge, redness and visual disturbance.  Respiratory: Negative for cough, chest tightness, shortness of breath and wheezing.   Cardiovascular: Negative for chest pain, palpitations and leg swelling.  Gastrointestinal: Negative for abdominal pain, blood in stool, diarrhea, nausea and vomiting.  Genitourinary: Negative for difficulty urinating, dysuria, flank pain, frequency, hematuria and urgency.  Musculoskeletal: Negative for arthralgias, back pain, joint swelling, myalgias and neck  stiffness.  Skin: Negative for pallor and rash.  Neurological: Negative for dizziness, speech difficulty, weakness and headaches.  Hematological: Negative for adenopathy. Does not bruise/bleed easily.  Psychiatric/Behavioral: Negative for confusion and sleep disturbance. The patient is not nervous/anxious.     PE; Blood pressure 122/76, pulse 69, temperature 98.3 F (36.8 C), temperature source Temporal, resp. rate 16, height 6' 1.5" (1.867 m), weight 206 lb 6.4 oz (93.6 kg), SpO2 97 %. Body mass index is 26.86 kg/m.  Gen: Alert, well appearing.  Patient is oriented to person, place, time, and situation. AFFECT: pleasant, lucid thought and speech. HEAD: scalp completely normal, no abnormal hair or abnl hair loss. ENT: Ears: EACs clear, normal epithelium.  TMs with good light reflex and landmarks bilaterally.  Eyes: no injection, icteris, swelling, or exudate.  EOMI, PERRLA. Nose: no drainage or turbinate edema/swelling.  No injection or focal lesion.  Mouth: lips without lesion/swelling.  Oral mucosa pink and moist.  Dentition intact and without obvious caries or gingival swelling.  Oropharynx without erythema, exudate, or swelling.  Neck: supple/nontender.  No LAD, mass, or TM.  Carotid pulses 2+ bilaterally, without bruits. CV: RRR, no m/r/g.  No chest wall tenderness or palpable abnormality. LUNGS: CTA bilat, nonlabored resps, good aeration in all lung fields. ABD: soft, NT, ND, BS normal.  No hepatospenomegaly or mass.  No bruits. EXT: no clubbing, cyanosis, or edema.  Musculoskeletal: no joint swelling, erythema, warmth, or tenderness.  ROM of all joints intact. Skin - no sores or suspicious lesions or rashes or color changes RECTAL: pt declined  Pertinent labs:  No results found for: TSH Lab Results  Component Value Date   WBC 4.7 06/07/2017   HGB 13.8 06/07/2017   HCT 39.5 06/07/2017   MCV 86.2 06/07/2017   PLT 300 06/07/2017   Lab Results  Component Value Date    CREATININE 0.99 06/12/2017   BUN 16 06/12/2017   NA 141 06/12/2017   K 4.8 06/12/2017   CL 106 06/12/2017   CO2 28 06/12/2017   Lab Results  Component Value Date   ALT 19 06/07/2017   AST 16 06/07/2017   BILITOT 0.9 06/07/2017   No results found for: CHOL No results found for: HDL No results found for: LDLCALC No results found for: TRIG No results found for: CHOLHDL No results found for: PSA  No results found for: HGBA1C   ASSESSMENT AND PLAN:   Health maintenance exam: Reviewed age and gender appropriate health maintenance issues (prudent diet, regular exercise, health risks of tobacco and  excessive alcohol, use of seatbelts, fire alarms in home, use of sunscreen).  Also reviewed age and gender appropriate health screening as well as vaccine recommendations. Vaccines: declines flu vaccine.  Tdap->he declines.  Shingrix -->he declines. Labs: health panel labs->ordered future. Prostate ca screening: due for initial screening->he declined DRE.  PSA ordered future. Colon ca screening: due for initial screening -->he declines this for now.  Reassured him regarding his chronic R lung complaint--exam/eval repeatedly normal. Also reassured him regarding itchy scalp-nervous habit of scratching is leading to scratch-itch-scratch cycle.  Discussed diverting his nervous energy to avoid habit of scratching.  An After Visit Summary was printed and given to the patient.  FOLLOW UP:  Return in about 1 year (around 04/01/2020) for annual CPE (fasting).  Also needs lab visit (fasting) at his earlies convenience.  Signed:  Crissie Sickles, MD           04/02/2019

## 2019-04-02 NOTE — Patient Instructions (Signed)

## 2019-04-03 ENCOUNTER — Ambulatory Visit (INDEPENDENT_AMBULATORY_CARE_PROVIDER_SITE_OTHER): Payer: BC Managed Care – PPO

## 2019-04-03 ENCOUNTER — Encounter: Payer: Self-pay | Admitting: Family Medicine

## 2019-04-03 DIAGNOSIS — R05 Cough: Secondary | ICD-10-CM

## 2019-04-03 DIAGNOSIS — R059 Cough, unspecified: Secondary | ICD-10-CM

## 2019-04-03 DIAGNOSIS — R0989 Other specified symptoms and signs involving the circulatory and respiratory systems: Secondary | ICD-10-CM

## 2019-04-03 DIAGNOSIS — Z0000 Encounter for general adult medical examination without abnormal findings: Secondary | ICD-10-CM

## 2019-04-03 DIAGNOSIS — Z125 Encounter for screening for malignant neoplasm of prostate: Secondary | ICD-10-CM | POA: Diagnosis not present

## 2019-04-03 DIAGNOSIS — L299 Pruritus, unspecified: Secondary | ICD-10-CM

## 2019-04-03 LAB — CBC WITH DIFFERENTIAL/PLATELET
Basophils Absolute: 0 10*3/uL (ref 0.0–0.1)
Basophils Relative: 0.7 % (ref 0.0–3.0)
Eosinophils Absolute: 0.1 10*3/uL (ref 0.0–0.7)
Eosinophils Relative: 2.4 % (ref 0.0–5.0)
HCT: 42.4 % (ref 39.0–52.0)
Hemoglobin: 14.1 g/dL (ref 13.0–17.0)
Lymphocytes Relative: 29.4 % (ref 12.0–46.0)
Lymphs Abs: 1.2 10*3/uL (ref 0.7–4.0)
MCHC: 33.2 g/dL (ref 30.0–36.0)
MCV: 91.9 fl (ref 78.0–100.0)
Monocytes Absolute: 0.4 10*3/uL (ref 0.1–1.0)
Monocytes Relative: 9.9 % (ref 3.0–12.0)
Neutro Abs: 2.4 10*3/uL (ref 1.4–7.7)
Neutrophils Relative %: 57.6 % (ref 43.0–77.0)
Platelets: 295 10*3/uL (ref 150.0–400.0)
RBC: 4.62 Mil/uL (ref 4.22–5.81)
RDW: 13 % (ref 11.5–15.5)
WBC: 4.2 10*3/uL (ref 4.0–10.5)

## 2019-04-03 LAB — COMPREHENSIVE METABOLIC PANEL
ALT: 20 U/L (ref 0–53)
AST: 19 U/L (ref 0–37)
Albumin: 4.3 g/dL (ref 3.5–5.2)
Alkaline Phosphatase: 52 U/L (ref 39–117)
BUN: 10 mg/dL (ref 6–23)
CO2: 29 mEq/L (ref 19–32)
Calcium: 9.3 mg/dL (ref 8.4–10.5)
Chloride: 104 mEq/L (ref 96–112)
Creatinine, Ser: 0.86 mg/dL (ref 0.40–1.50)
GFR: 93.79 mL/min (ref >60.00)
Glucose, Bld: 97 mg/dL (ref 70–99)
Potassium: 4.8 mEq/L (ref 3.5–5.1)
Sodium: 139 mEq/L (ref 135–145)
Total Bilirubin: 1 mg/dL (ref 0.2–1.2)
Total Protein: 6.7 g/dL (ref 6.0–8.3)

## 2019-04-03 LAB — LIPID PANEL
Cholesterol: 222 mg/dL — ABNORMAL HIGH (ref 0–200)
HDL: 58.9 mg/dL (ref >39.00)
LDL Cholesterol: 142 mg/dL — ABNORMAL HIGH (ref 0–99)
NonHDL: 163.28
Total CHOL/HDL Ratio: 4
Triglycerides: 104 mg/dL (ref 0.0–149.0)
VLDL: 20.8 mg/dL (ref 0.0–40.0)

## 2019-04-03 LAB — TSH: TSH: 2.59 u[IU]/mL (ref 0.35–4.50)

## 2019-04-03 LAB — PSA: PSA: 0.83 ng/mL (ref 0.10–4.00)

## 2019-04-03 NOTE — Telephone Encounter (Signed)
OK, referrals to both pulmonology and dermatology ordered.

## 2019-04-13 ENCOUNTER — Encounter: Payer: Self-pay | Admitting: Family Medicine

## 2019-04-21 ENCOUNTER — Encounter: Payer: Self-pay | Admitting: Internal Medicine

## 2019-04-21 ENCOUNTER — Ambulatory Visit: Payer: BC Managed Care – PPO | Admitting: Internal Medicine

## 2019-04-21 ENCOUNTER — Other Ambulatory Visit: Payer: Self-pay

## 2019-04-21 DIAGNOSIS — J432 Centrilobular emphysema: Secondary | ICD-10-CM | POA: Insufficient documentation

## 2019-04-21 DIAGNOSIS — J479 Bronchiectasis, uncomplicated: Secondary | ICD-10-CM | POA: Diagnosis not present

## 2019-04-21 DIAGNOSIS — R058 Other specified cough: Secondary | ICD-10-CM | POA: Insufficient documentation

## 2019-04-21 DIAGNOSIS — R05 Cough: Secondary | ICD-10-CM

## 2019-04-21 NOTE — Progress Notes (Signed)
Andrew Cantu, male    DOB: 19-Oct-1965,   MRN: [id number]   Brief patient profile:  53 yo Andrew Cantu male exp to [place] as child and smoked until 03/18/17 when quit p father died "due to lungs" and subsequently w/in about a year onset of cough with sensation of a tickle above R breast but no excess mucus and eval by Andrew Cantu with nl cxr and ? nl pfts no recs so referred to pulmonary clinic 04/21/2019 by Dr   Andrew Cantu.     History of Present Illness  04/21/2019  Pulmonary/ 1st office eval/Andrew Cantu  Chief Complaint  Patient presents with  . Pulmonary Consult    Referred by Dr. Gertie Cantu.  Pt c/o sensation of mucus "coming from right lung" for the past year.   Dyspnea:  2-3 x per day takes long walks/ does IT ex tol "not what it used to be"  Cough: constant urge to cough / worse p wakes up > never produces any mucus "always just swallow it'  Sleep: fine R side down/ cough does not disturb sleep   SABA use: no / using lots of mints/ cough drops   No obvious day to day or daytime variability or assoc excess/ purulent sputum or mucus plugs or hemoptysis or cp or chest tightness, subjective wheeze or overt sinus or hb symptoms.   Sleeping  without nocturnal  or early am exacerbation  of respiratory  c/o's or need for noct saba. Also denies any obvious fluctuation of symptoms with weather or environmental changes or other aggravating or alleviating factors except as outlined above   No unusual exposure hx or h/o childhood pna/ asthma or knowledge of premature birth.  Current Allergies, Complete Past Medical History, Past Surgical History, Family History, and Social History were reviewed in [facility] record.  ROS  The following are not active complaints unless bolded Hoarseness, sore throat, dysphagia, dental problems, itching, sneezing,  nasal congestion or discharge of excess mucus or purulent secretions, ear ache,   fever, chills, sweats, unintended wt loss or wt gain,  classically pleuritic or exertional cp,  orthopnea pnd or arm/hand swelling  or leg swelling, presyncope, palpitations, abdominal pain, anorexia, nausea, vomiting, diarrhea  or change in bowel habits or change in bladder habits, change in stools or change in urine, dysuria, hematuria,  rash, arthralgias, visual complaints, headache, numbness, weakness or ataxia or problems with walking or coordination,  change in mood or  memory.               Past Medical History:  Diagnosis Date  . Anxiety    particularly about his health  . Discomfort in chest 2018/2019   R side: pt says he feels like there is something in his R lung.  He cannot be reassured that he is ok despite normal ESR, spirometry, and CXR-->therefore, Pulmonologist has ordered CT chest as of 12/05/17.  Marland Kitchen Elevated serum creatinine    Cr 1.8 (eGFR 43).  Normal on f/u testing.  . Kidney stone 05/2017   Asymptomatic 8 mm nonobstructing stone in midportion of R kidney (stone vs small angiomyolipoma).  Plan--active surveillance with repeat renal u/s around 11/2017.    No outpatient medications prior to visit.   No facility-administered medications prior to visit.      Objective:     BP 134/72 (BP Location: Left Arm, Cuff Size: Normal)   Pulse 66   Temp (!) 97.4 F (36.3 C) (Oral)   Ht '6\' 1"'$  (1.854 m)  Wt 203 lb (92.1 kg)   SpO2 98% Comment: on RA  BMI 26.78 kg/m   SpO2: 98 %(on RA)   Pleasant but anxious amb romainian male with freq throat clearing    HEENT : pt wearing mask not removed for exam due to covid -19 concerns.    NECK :  without JVD/Nodes/TM/ nl carotid upstrokes bilaterally   LUNGS: no acc muscle use,  Nl contour chest which is clear to A and P bilaterally without cough on insp or exp maneuvers   CV:  RRR  no s3 or murmur or increase in P2, and no edema   ABD:  soft and nontender with nl inspiratory excursion in the supine position. No bruits or organomegaly appreciated, bowel sounds nl  MS:  Nl  gait/ ext warm without deformities, calf tenderness, cyanosis or clubbing No obvious joint restrictions   SKIN: warm and dry without lesions    NEURO:  alert, approp, nl sensorium with  no motor or cerebellar deficits apparent.     I personally reviewed images and agree with radiology impression as follows:  CXR:   09/15/2018 No active cardiopulmonary disease.     Assessment   Bronchiectasis without complication ([place]) Exposed to wood burning for heat as child in [place] smoking 2018  - HRCT chest ordered   He does not clinically have much in terms of airflow obst and main symptom is chest "tickling" and constant urge to clear throat while awake typical of irritable larynx syndrome which may require trial of gabapentin to suppress but for now just rec non-mint/menthol candies and gerd diet pending hrct results.      Upper airway cough syndrome Onset 2018   Of the three most common causes of  Sub-acute / recurrent or chronic cough, only one (GERD)  can actually contribute to/ trigger  the other two (asthma and post nasal drip syndrome)  and perpetuate the cylce of cough.  While not intuitively obvious, many patients with chronic low grade reflux do not cough until there is a primary insult that disturbs the protective epithelial barrier and exposes sensitive nerve endings.   This is typically viral but can due to PNDS and  either may apply here.     >>> The point is that once this occurs, it is difficult to eliminate the cycle  As is the case here but since so chronic will first try non-mint/menthol hard rock candies and ask him to try to avoid throat clearing then if not better add gerd rx then gabapentin in that order.     Total time devoted to counseling  > 50 % of initial 60 min office visit:  review case with pt/ discussion of options/alternatives/ personally creating written customized instructions  in presence of pt  then going over those specific  Instructions  directly with the pt including how to use all of the meds but in particular covering each new medication in detail and the difference between the maintenance= "automatic" meds and the prns using an action plan format for the latter (If this problem/symptom => do that organization reading Left to right).  Please see AVS from this visit for a full list of these instructions which I personally wrote for this pt and  are unique to this visit.        Christinia Gully, MD 04/21/2019

## 2019-04-21 NOTE — Patient Instructions (Addendum)
Most likely you have a mild form of bronchiectasis which can only be diagnosed reliably with a High Resolution CT   bronchiecatasis means that the muciliary escalator is not working   Bronchiectasis =   you have scarring of your bronchial tubes which means that they don't function perfectly normally and mucus tends to pool in certain areas of your lung which can cause pneumonia and further scarring of your lung and bronchial tubes  Whenever you develop cough congestion take mucinex or mucinex dm > these will help keep the mucus loose and flowing but if your condition worsens you need to seek help immediately preferably here or somewhere inside the [facility] system to compare xrays ( worse = darker or bloody mucus or pain on breathing in)     GERD (REFLUX)  is an extremely common cause of respiratory symptoms just like yours , many times with no obvious heartburn at all.    It can be treated with medication, but also with lifestyle changes including elevation of the head of your bed (ideally with 6 -8inch blocks under the headboard of your bed),  Smoking cessation, avoidance of late meals, excessive alcohol, and avoid fatty foods, chocolate, peppermint, colas, red wine, and acidic juices such as orange juice.  NO MINT OR MENTHOL PRODUCTS SO NO COUGH DROPS  USE HARD ROCK CANDY INSTEAD (Jolley ranchers or Stover's or Life Savers) or even ice chips will also do - the key is to swallow to prevent all throat clearing. NO OIL BASED VITAMINS - use powdered substitutes.  Avoid fish oil when coughing.  Please schedule a follow up office visit in 4 weeks, sooner if needed

## 2019-04-21 NOTE — Assessment & Plan Note (Signed)
Onset 2018   Of the three most common causes of  Sub-acute / recurrent or chronic cough, only one (GERD)  can actually contribute to/ trigger  the other two (asthma and post nasal drip syndrome)  and perpetuate the cylce of cough.  While not intuitively obvious, many patients with chronic low grade reflux do not cough until there is a primary insult that disturbs the protective epithelial barrier and exposes sensitive nerve endings.   This is typically viral but can due to PNDS and  either may apply here.     >>> The point is that once this occurs, it is difficult to eliminate the cycle  As is the case here but since so chronic will first try non-mint/menthol hard rock candies and ask him to try to avoid throat clearing then if not better add gerd rx then gabapentin in that order.Marland Kitchen

## 2019-04-21 NOTE — Assessment & Plan Note (Signed)
Exposed to wood burning for heat as child in [place] smoking 2018  - HRCT chest ordered   He does not clinically have much in terms of airflow obst and main symptom is chest "tickling" and constant urge to clear throat while awake typical of irritable larynx syndrome which may require trial of gabapentin to suppress but for now just rec non-mint/menthol candies and gerd diet pending hrct results.    Total time devoted to counseling  > 50 % of initial 60 min office visit:  review case with pt/ discussion of options/alternatives/ personally creating written customized instructions  in presence of pt  then going over those specific  Instructions directly with the pt including how to use all of the meds but in particular covering each new medication in detail and the difference between the maintenance= "automatic" meds and the prns using an action plan format for the latter (If this problem/symptom => do that organization reading Left to right).  Please see AVS from this visit for a full list of these instructions which I personally wrote for this pt and  are unique to this visit.

## 2019-05-26 ENCOUNTER — Encounter: Payer: Self-pay | Admitting: Family Medicine

## 2019-05-29 ENCOUNTER — Telehealth: Payer: Self-pay | Admitting: Internal Medicine

## 2019-05-29 DIAGNOSIS — J471 Bronchiectasis with (acute) exacerbation: Secondary | ICD-10-CM

## 2019-05-29 NOTE — Telephone Encounter (Signed)
Yes I had intended to order hrct chest dx bronchiectasis so go ahead and put it in

## 2019-05-29 NOTE — Telephone Encounter (Signed)
HRCT ordered  

## 2019-05-29 NOTE — Telephone Encounter (Signed)
Dr. Melvyn Novas did you want a HRCT ordered on this patient? Your AVS states HRCT is the only way to dx however we are not clear if you wanted to order it.   Thanks.

## 2019-05-29 NOTE — Telephone Encounter (Signed)
Patient saw Dr. Melvyn Novas on 04/21/2019 but I don't see a CT order that has been placed for [place] to schedule

## 2019-06-04 ENCOUNTER — Other Ambulatory Visit: Payer: Self-pay

## 2019-06-04 ENCOUNTER — Ambulatory Visit (INDEPENDENT_AMBULATORY_CARE_PROVIDER_SITE_OTHER)
Admission: RE | Admit: 2019-06-04 | Discharge: 2019-06-04 | Disposition: A | Discharge status: Home / Self Care | Payer: BC Managed Care – PPO | Source: Ambulatory Visit | Attending: Internal Medicine | Admitting: Internal Medicine

## 2019-06-04 DIAGNOSIS — J471 Bronchiectasis with (acute) exacerbation: Secondary | ICD-10-CM

## 2019-06-05 ENCOUNTER — Telehealth: Payer: Self-pay | Admitting: Internal Medicine

## 2019-06-05 NOTE — Progress Notes (Signed)
LMTCB

## 2019-06-05 NOTE — Telephone Encounter (Signed)
Advised pt of results. Pt understood and nothing further is needed. Made appointment for 08/05/2018 at 11:00 for PFT and then OV with MW at 12:00pm.    Notes recorded by Tanda Rockers, MD on 06/05/2019 at 10:41 AM EST  Call patient : Study is negative for immediate concern but does suggest some lung damage from prior exposures (like breathing in [place] In [place] > rec ov with pfts next available to close the loop but no med changes needed

## 2019-06-22 ENCOUNTER — Encounter: Payer: Self-pay | Admitting: Family Medicine

## 2019-06-23 ENCOUNTER — Other Ambulatory Visit: Payer: Self-pay

## 2019-07-31 DIAGNOSIS — R058 Other specified cough: Secondary | ICD-10-CM

## 2019-07-31 DIAGNOSIS — R05 Cough: Secondary | ICD-10-CM

## 2019-07-31 HISTORY — DX: Other specified cough: R05.8

## 2019-07-31 HISTORY — DX: Cough: R05

## 2019-08-05 ENCOUNTER — Ambulatory Visit: Payer: BC Managed Care – PPO | Admitting: Internal Medicine

## 2019-08-05 ENCOUNTER — Other Ambulatory Visit: Payer: Self-pay

## 2019-08-05 ENCOUNTER — Telehealth: Payer: Self-pay | Admitting: Internal Medicine

## 2019-08-05 ENCOUNTER — Encounter: Payer: Self-pay | Admitting: Internal Medicine

## 2019-08-05 DIAGNOSIS — J432 Centrilobular emphysema: Secondary | ICD-10-CM

## 2019-08-05 DIAGNOSIS — R05 Cough: Secondary | ICD-10-CM

## 2019-08-05 DIAGNOSIS — R058 Other specified cough: Secondary | ICD-10-CM

## 2019-08-05 MED ORDER — PANTOPRAZOLE SODIUM 40 MG PO TBEC: 40.0000 mg | DELAYED_RELEASE_TABLET | Freq: Every day | ORAL | 2 refills | Status: DC

## 2019-08-05 MED ORDER — FAMOTIDINE 20 MG PO TABS: ORAL_TABLET | ORAL | 11 refills | Status: DC

## 2019-08-05 NOTE — Patient Instructions (Addendum)
Pantoprazole (protonix) 40 mg   Take  30-60 min before first meal of the day and Pepcid (famotidine)  20 mg one after supper or bedtime  until return to office - this is the best way to tell whether stomach acid is contributing to your problem.    GERD (REFLUX)  is an extremely common cause of respiratory symptoms just like yours , many times with no obvious heartburn at all.    It can be treated with medication, but also with lifestyle changes including elevation of the head of your bed (ideally with 6-8inch blocks under the headboard of your bed),  Smoking cessation, avoidance of late meals, excessive alcohol, and avoid fatty foods, chocolate, peppermint, colas, red wine, and acidic juices such as orange juice.  NO MINT OR MENTHOL PRODUCTS SO NO COUGH DROPS  USE SUGARLESS CANDY INSTEAD (Jolley ranchers or Stover's or Life Savers) or even ice chips will also do - the key is to swallow to prevent all throat clearing. NO OIL BASED VITAMINS - use powdered substitutes.  Avoid fish oil when coughing.     Please schedule a follow up visit in 3 months but call sooner if needed PFTs  And alpha one Phenotype

## 2019-08-05 NOTE — Telephone Encounter (Signed)
I scheduled this pt for PFT for 11/06/19  Please schedule covid test thanks

## 2019-08-05 NOTE — Progress Notes (Signed)
Andrew Cantu, male    DOB: 05/08/1966,   MRN: [id number]   Brief patient profile:  54 yo [place] male exp to [place] as child and smoked until 03/11/2017 when quit p father died "due to lungs" and subsequently w/in about a year onset of cough with sensation of a tickle above R breast but no excess mucus and eval by [facility] with nl cxr and ? nl pfts no recs so referred to pulmonary clinic 04/21/2019 by Dr   Andrew Cantu.     History of Present Illness  04/21/2019  Pulmonary/ 1st office eval/Andrew Cantu  Chief Complaint  Patient presents with  . Pulmonary Consult    Referred by Dr. Gertie Cantu.  Pt c/o sensation of mucus "coming from right lung" for the past year.   Dyspnea:  2-3 x per day takes long walks/ does IT ex tol "not what it used to be"  Cough: constant urge to cough / worse p wakes up > never produces any mucus "always just swallow it'  Sleep: fine R side down/ cough does not disturb sleep   SABA use: no / using lots of mints/ cough drops  rec Most likely you have a mild form of bronchiectasis which can only be diagnosed reliably with a High Resolution CT  Bronchiecatasis means that the muciliary escalator is not working  GERD diet    08/05/2019  f/u ov/Andrew Cantu re: emphysema on CT - no bronchiectasis  Chief Complaint  Patient presents with  . Follow-up    Pt states that the sensation of mucus coming from his right lung seems more consistant. Here to discuss.   Dyspnea:  Can still job but only about a quarter of a mile / MMRC1 = can walk nl pace, flat grade, can't hurry or go uphills or steps s sob   Cough: never productive Sleeping: R side down, two pillows  SABA use: none  02: none    No obvious day to day or daytime variability or assoc excess/ purulent sputum or mucus plugs or hemoptysis or cp or chest tightness, subjective wheeze or overt sinus or hb symptoms.   Sleeping as above  without nocturnal  or early am exacerbation  of respiratory  c/o's or need for noct saba. Also  denies any obvious fluctuation of symptoms with weather or environmental changes or other aggravating or alleviating factors except as outlined above   No unusual exposure hx or h/o childhood pna/ asthma or knowledge of premature birth.  Current Allergies, Complete Past Medical History, Past Surgical History, Family History, and Social History were reviewed in [employer] record.  ROS  The following are not active complaints unless bolded Hoarseness, sore throat, dysphagia, dental problems, itching, sneezing,  nasal congestion or discharge of excess mucus or purulent secretions, ear ache,   fever, chills, sweats, unintended wt loss or wt gain, classically pleuritic or exertional cp,  orthopnea pnd or arm/hand swelling  or leg swelling, presyncope, palpitations, abdominal pain, anorexia, nausea, vomiting, diarrhea  or change in bowel habits or change in bladder habits, change in stools or change in urine, dysuria, hematuria,  rash, arthralgias, visual complaints, headache, numbness, weakness or ataxia or problems with walking or coordination,  change in mood or  memory.        Current Meds  Medication Sig  . DERMA-SMOOTHE/FS SCALP 0.01 % OIL SMARTSIG:1 Sparingly Topical Twice a Week PRN               Past Medical History:  Diagnosis Date  . Anxiety    particularly about his health  . Discomfort in chest 2018/2019   R side: pt says he feels like there is something in his R lung.  He cannot be reassured that he is ok despite normal ESR, spirometry, and CXR-->therefore, Pulmonologist has ordered CT chest as of 12/05/17.  Marland Kitchen Elevated serum creatinine    Cr 1.8 (eGFR 43).  Normal on f/u testing.  . Kidney stone 05/2017   Asymptomatic 8 mm nonobstructing stone in midportion of R kidney (stone vs small angiomyolipoma).  Plan--active surveillance with repeat renal u/s around 11/2017.      Objective:    amb wm nad  Wt Readings from Last 3 Encounters:  08/05/19 207 lb  (93.9 kg)  04/21/19 203 lb (92.1 kg)  04/02/19 206 lb 6.4 oz (93.6 kg)     Vital signs reviewed - Note on arrival 02 sats  99% on RA      HEENT : pt wearing mask not removed for exam due to covid -19 concerns.    NECK :  without JVD/Nodes/TM/ nl carotid upstrokes bilaterally   LUNGS: no acc muscle use,  Nl contour chest which is clear to A and P bilaterally without cough on insp or exp maneuvers   CV:  RRR  no s3 or murmur or increase in P2, and no edema   ABD:  soft and nontender with nl inspiratory excursion in the supine position. No bruits or organomegaly appreciated, bowel sounds nl  MS:  Nl gait/ ext warm without deformities, calf tenderness, cyanosis or clubbing No obvious joint restrictions   SKIN: warm and dry without lesions    NEURO:  alert, approp, nl sensorium with  no motor or cerebellar deficits apparent.         I personally reviewed images and agree with radiology impression as follows:   Chest HRCT  06/04/2019 1. No evidence of fibrotic interstitial lung disease. 2. Mild air trapping, indicative of small airways disease. 3. Scattered tiny pulmonary nodules. No follow-up needed if patient is low-risk (and has no known or suspected primary neoplasm). Non-contrast chest CT can be considered in 12 months if patient is high-risk. This recommendation follows the consensus statement: Guidelines for Management of Incidental Pulmonary Nodules Detected on CT Images: From the Fleischner Society 2017; Radiology 2017; 284:228-243. 4.  Emphysema (ICD10-J43.9).      Assessment

## 2019-08-06 ENCOUNTER — Encounter: Payer: Self-pay | Admitting: Internal Medicine

## 2019-08-06 ENCOUNTER — Ambulatory Visit: Payer: BC Managed Care – PPO | Admitting: Internal Medicine

## 2019-08-06 NOTE — Assessment & Plan Note (Signed)
Onset 2018 with neg CT chest 06/04/2019  Of the three most common causes of  Sub-acute / recurrent or chronic cough, only one (GERD)  can actually contribute to/ trigger  the other two (asthma and post nasal drip syndrome)  and perpetuate the cylce of cough.  While not intuitively obvious, many patients with chronic low grade reflux do not cough until there is a primary insult that disturbs the protective epithelial barrier and exposes sensitive nerve endings.   This is typically viral but can due to PNDS and  either may apply here.    >>>  The point is that once this occurs, it is difficult to eliminate the cycle  using anything but a maximally effective acid suppression regimen at least in the short run, accompanied by an appropriate diet to address non acid GERD and f/u with sinus CT if not improved and or trial of gabapentin for irritable larynx.          Each maintenance medication was reviewed in detail including emphasizing most importantly the difference between maintenance and prns and under what circumstances the prns are to be triggered using an action plan format that is not reflected in the computer generated alphabetically organized AVS which I have not found useful in most complex patients, especially with respiratory illnesses  Total time for H and P, chart and in person  imagining review during the visit, counseling,  and generating AVS / charting =  30 min

## 2019-08-06 NOTE — Telephone Encounter (Signed)
Will call patient closer to PFT appt date as per protocol.

## 2019-08-06 NOTE — Assessment & Plan Note (Addendum)
Exposed to wood burning for heat as child in [place] smoking 2018  - HRCT chest 06/05/2019  1. No evidence of fibrotic interstitial lung disease. 2. Mild air trapping, indicative of small airways disease. 3. Scattered tiny pulmonary nodules.  high-risk.  4.  Emphysema (ICD10-J43.9). > rec ov with pfts and alpha one phenotype    I reviewed the Fletcher curve with the patient that basically indicates  if you quit smoking when your best day FEV1 is still well preserved (as is very likley  the case here)  it is highly unlikely you will progress to severe disease and informed the patient there was  no medication on the market that has proven to alter the curve/ its downward trajectory  or the likelihood of progression of their disease(unlike other chronic medical conditions such as atheroclerosis where we do think we can change the natural hx with risk reducing meds)    Therefore stopping smoking and maintaining abstinence are  the most important aspects of care, not choice of inhalers or for that matter, doctors.   Treatment other than smoking cessation  is entirely directed by severity of symptoms and focused also on reducing exacerbations, not attempting to change the natural history of the disease.  Since minimal symptoms no need for rx at this point.  >> return as above in 3 m to complete w/u - no need to push sooner due to pandemic

## 2019-08-26 IMAGING — US US RENAL
1 series · 14 of 25 positions shown · non-contrast
Comparison: None.

CLINICAL DATA: Elevated serum creatinine.

EXAM:
RENAL / URINARY TRACT ULTRASOUND COMPLETE

[Series 1: us renal · 0.25mm/px · 14 of 40 slices shown]
[im 1/40]
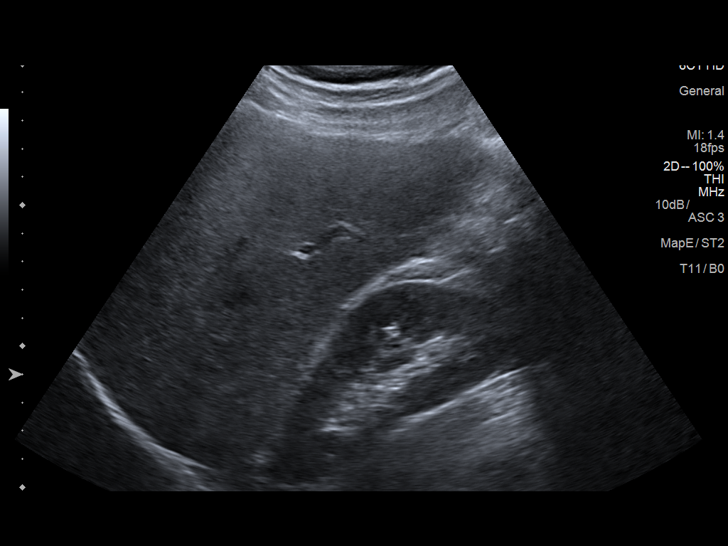
[im 4/40]
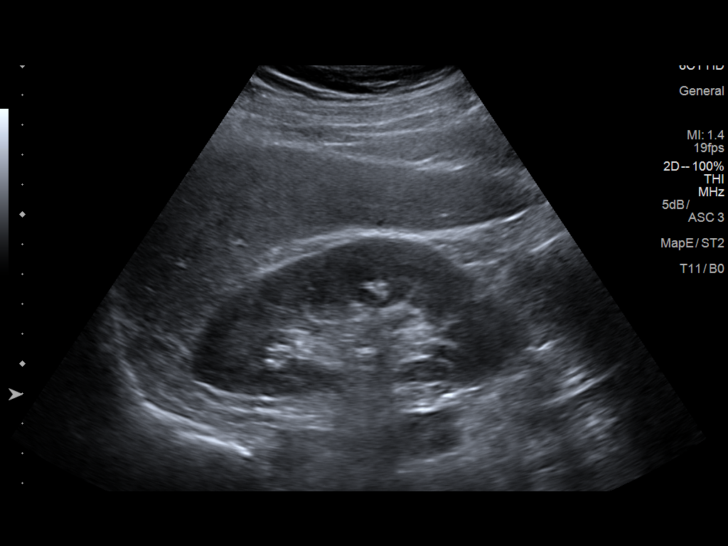
[im 7/40]
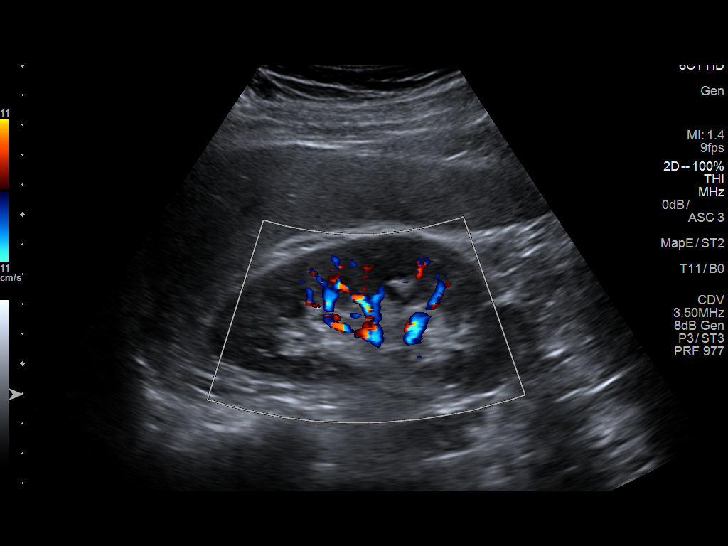
[im 10/40]
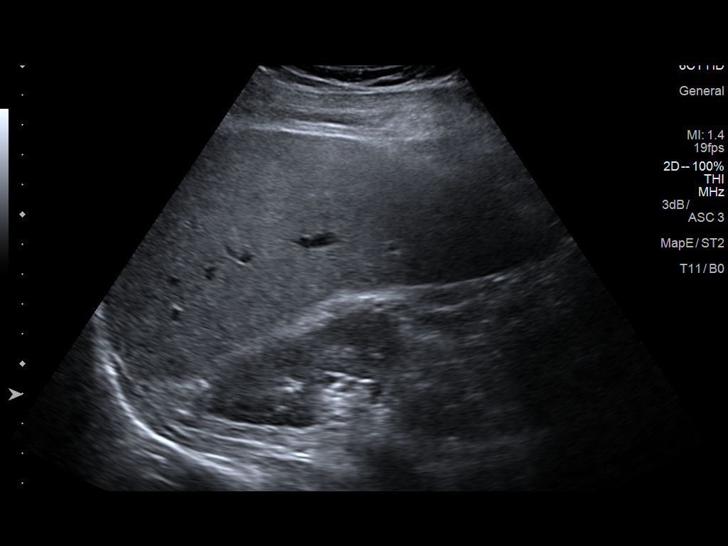
[im 14/40]
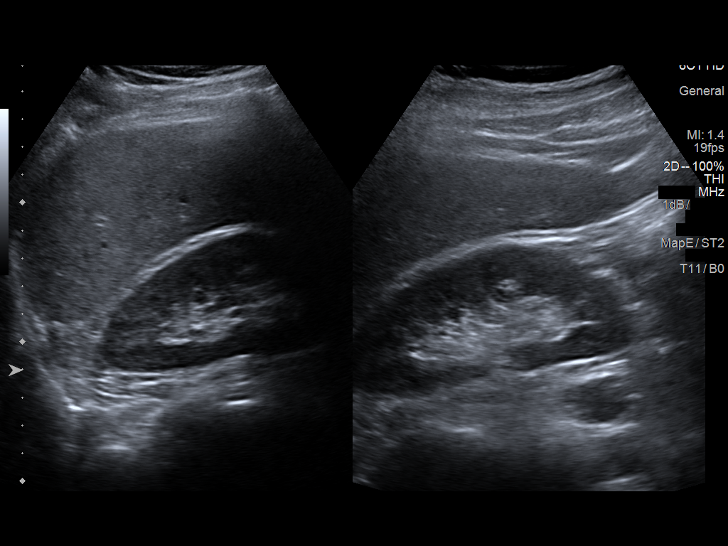
[im 15/40]
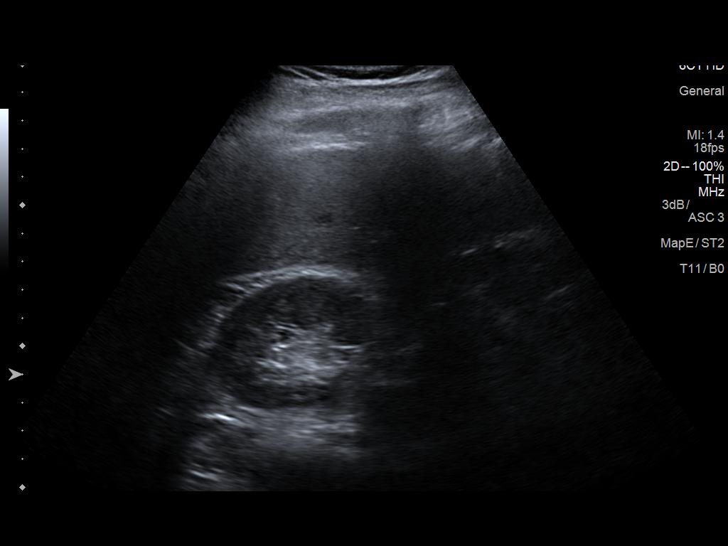
[im 18/40]
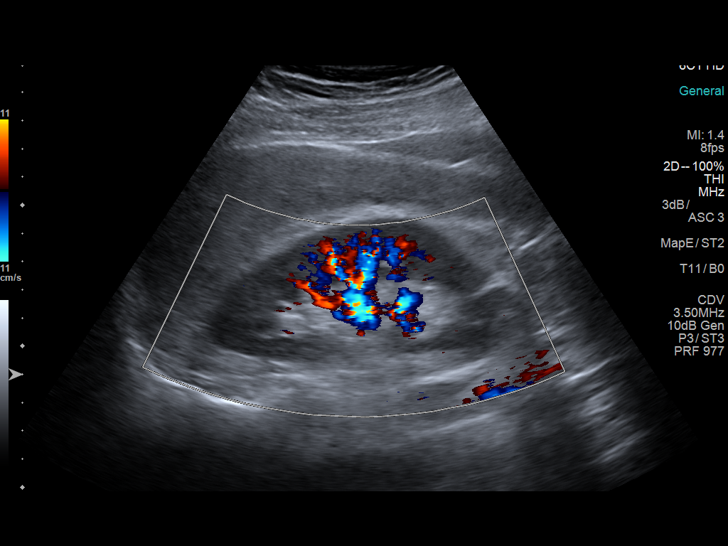
[im 22/40]
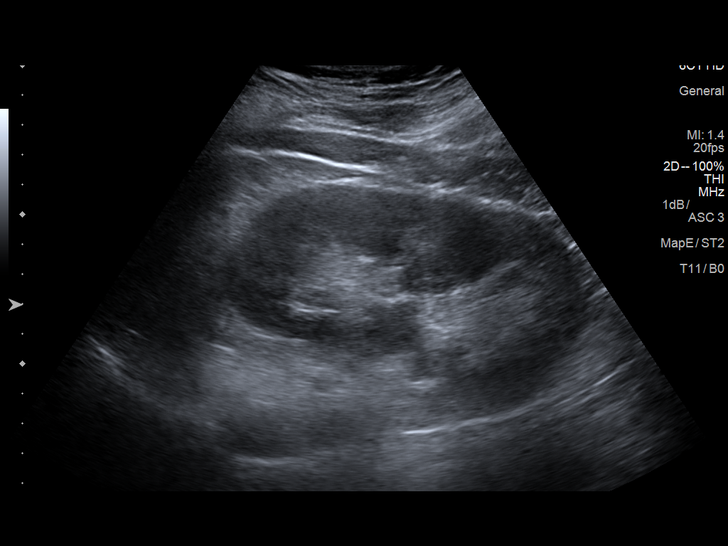
[im 25/40]
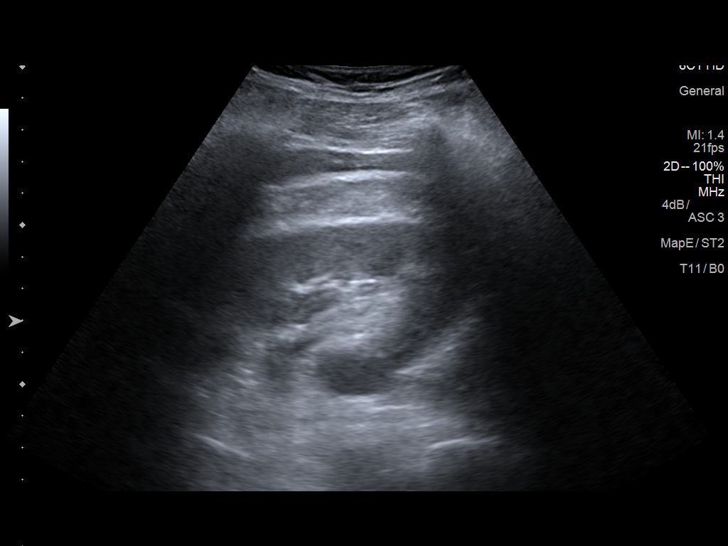
[im 27/40]
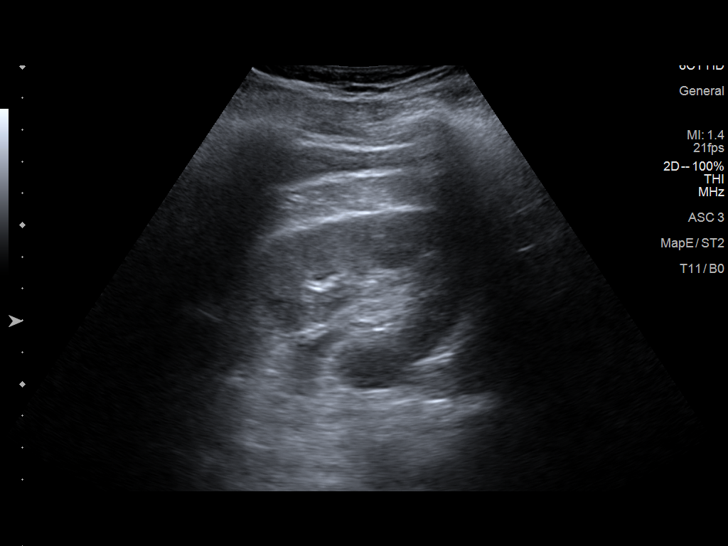
[im 30/40]
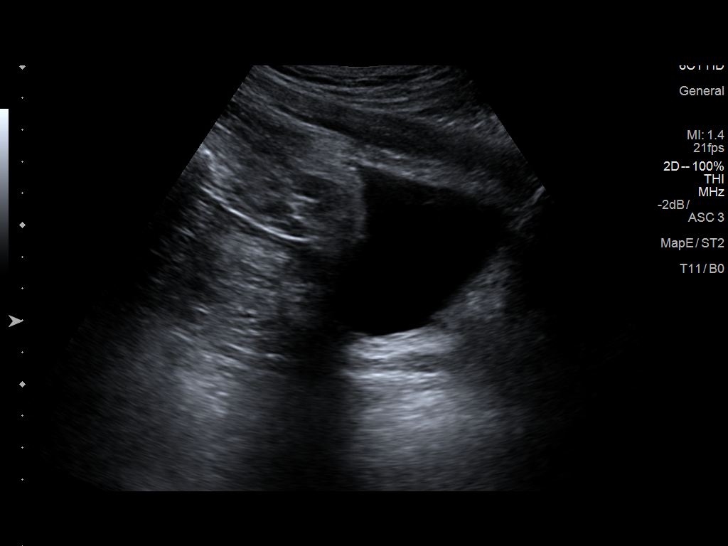
[im 33/40]
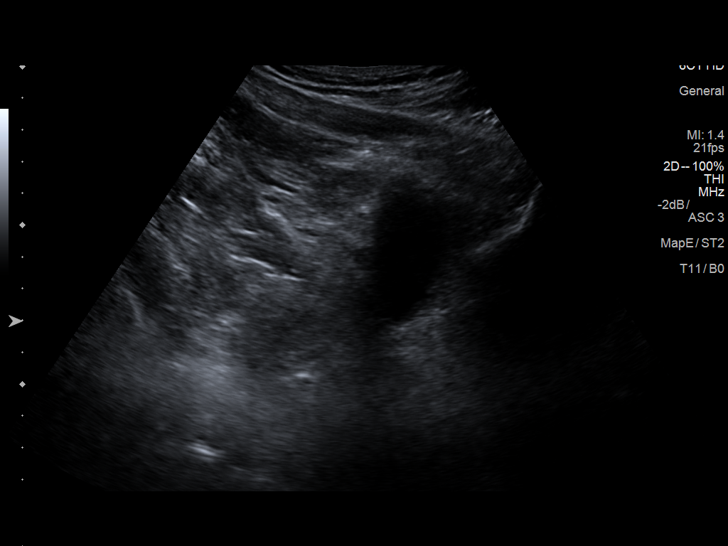
[im 36/40]
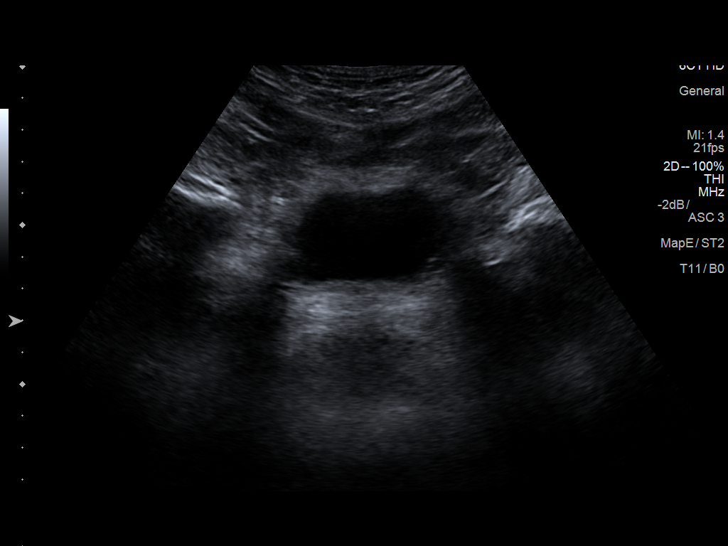
[im 40/40]
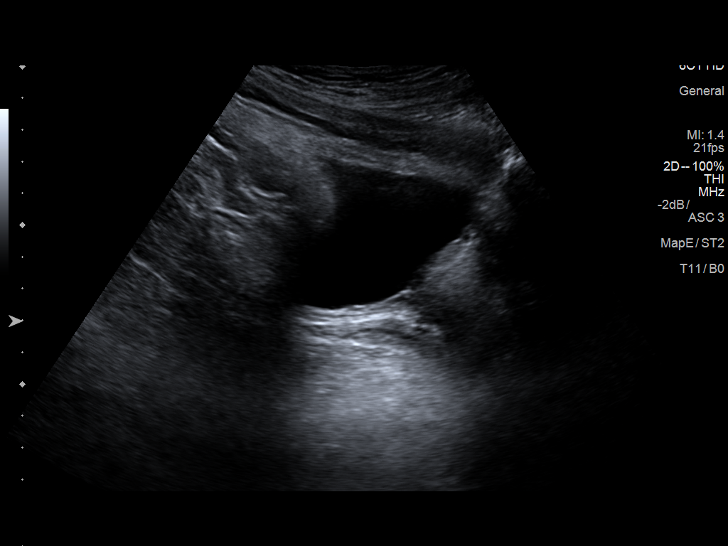

[14 of 25 positions shown; findings below may reference images not displayed]

FINDINGS: Right Kidney:

Length: 11.4 cm. 8 mm nonobstructing stone in the midportion.
Alternatively, this could be a small angiomyolipoma. Echogenicity
within normal limits. No mass or hydronephrosis visualized.

Left Kidney:

Length: 12.2 cm. Echogenicity within normal limits. No mass or
hydronephrosis visualized.

Bladder:

Appears normal for degree of bladder distention.
IMPRESSION: Normal renal size, cortical volume and renal echogenicity. The only
abnormality is a presumably incidental 8 mm nonobstructing stone or
angiomyolipoma in the right kidney.

## 2019-08-28 ENCOUNTER — Encounter: Payer: Self-pay | Admitting: Family Medicine

## 2019-11-03 ENCOUNTER — Inpatient Hospital Stay (HOSPITAL_COMMUNITY): Admission: RE | Admit: 2019-11-03 | Payer: BC Managed Care – PPO | Source: Ambulatory Visit

## 2019-11-06 ENCOUNTER — Ambulatory Visit: Payer: BC Managed Care – PPO | Admitting: Internal Medicine

## 2019-11-13 ENCOUNTER — Telehealth: Payer: Self-pay | Admitting: Internal Medicine

## 2019-11-13 NOTE — Telephone Encounter (Signed)
Instructions from last OV with MW 08/05/19  Pantoprazole (protonix) 40 mg   Take  30-60 min before first meal of the day and Pepcid (famotidine)  20 mg one after supper or bedtime  until return to office - this is the best way to tell whether stomach acid is contributing to your problem.    GERD (REFLUX)  is an extremely common cause of respiratory symptoms just like yours , many times with no obvious heartburn at all.    It can be treated with medication, but also with lifestyle changes including elevation of the head of your bed (ideally with 6-8inch blocks under the headboard of your bed),  Smoking cessation, avoidance of late meals, excessive alcohol, and avoid fatty foods, chocolate, peppermint, colas, red wine, and acidic juices such as orange juice.  NO MINT OR MENTHOL PRODUCTS SO NO COUGH DROPS  USE SUGARLESS CANDY INSTEAD (Jolley ranchers or Stover's or Life Savers) or even ice chips will also do - the key is to swallow to prevent all throat clearing. NO OIL BASED VITAMINS - use powdered substitutes.  Avoid fish oil when coughing.     Please schedule a follow up visit in 3 months but call sooner if needed PFTs  And alpha one Phenotype      Order was not placed after pt's last OV for the PFT and PFT was also not scheduled.  Dr. Melvyn Novas is also going to be in [place] the day of pt's appt. Routing this to front desk for review.

## 2019-11-13 NOTE — Telephone Encounter (Signed)
Please verify patients appt status.  Says it is with a PFT that looks like it is not on the schedule.  No cov test I can find.

## 2019-11-16 ENCOUNTER — Ambulatory Visit: Payer: BC Managed Care – PPO | Admitting: Internal Medicine

## 2019-11-24 NOTE — Telephone Encounter (Signed)
Left message on cell phone voice mail to reschedule appt.  ta

## 2019-12-01 NOTE — Telephone Encounter (Signed)
Called and left message on pt vm to call back to r/s appt and pft -pr

## 2019-12-10 NOTE — Telephone Encounter (Signed)
Called and left message on pt vm to call back to schedule pft and f/u appt with MW - 3rd attempt - routing back to Dr. Melvyn Novas as a FYI -pr

## 2020-01-03 IMAGING — DX DG CHEST 2V
2 series · 2 of 2 positions shown · non-contrast
Comparison: June 07, 2017.

CLINICAL DATA: Right chest pain

EXAM:
CHEST - 2 VIEW

[chest pa]
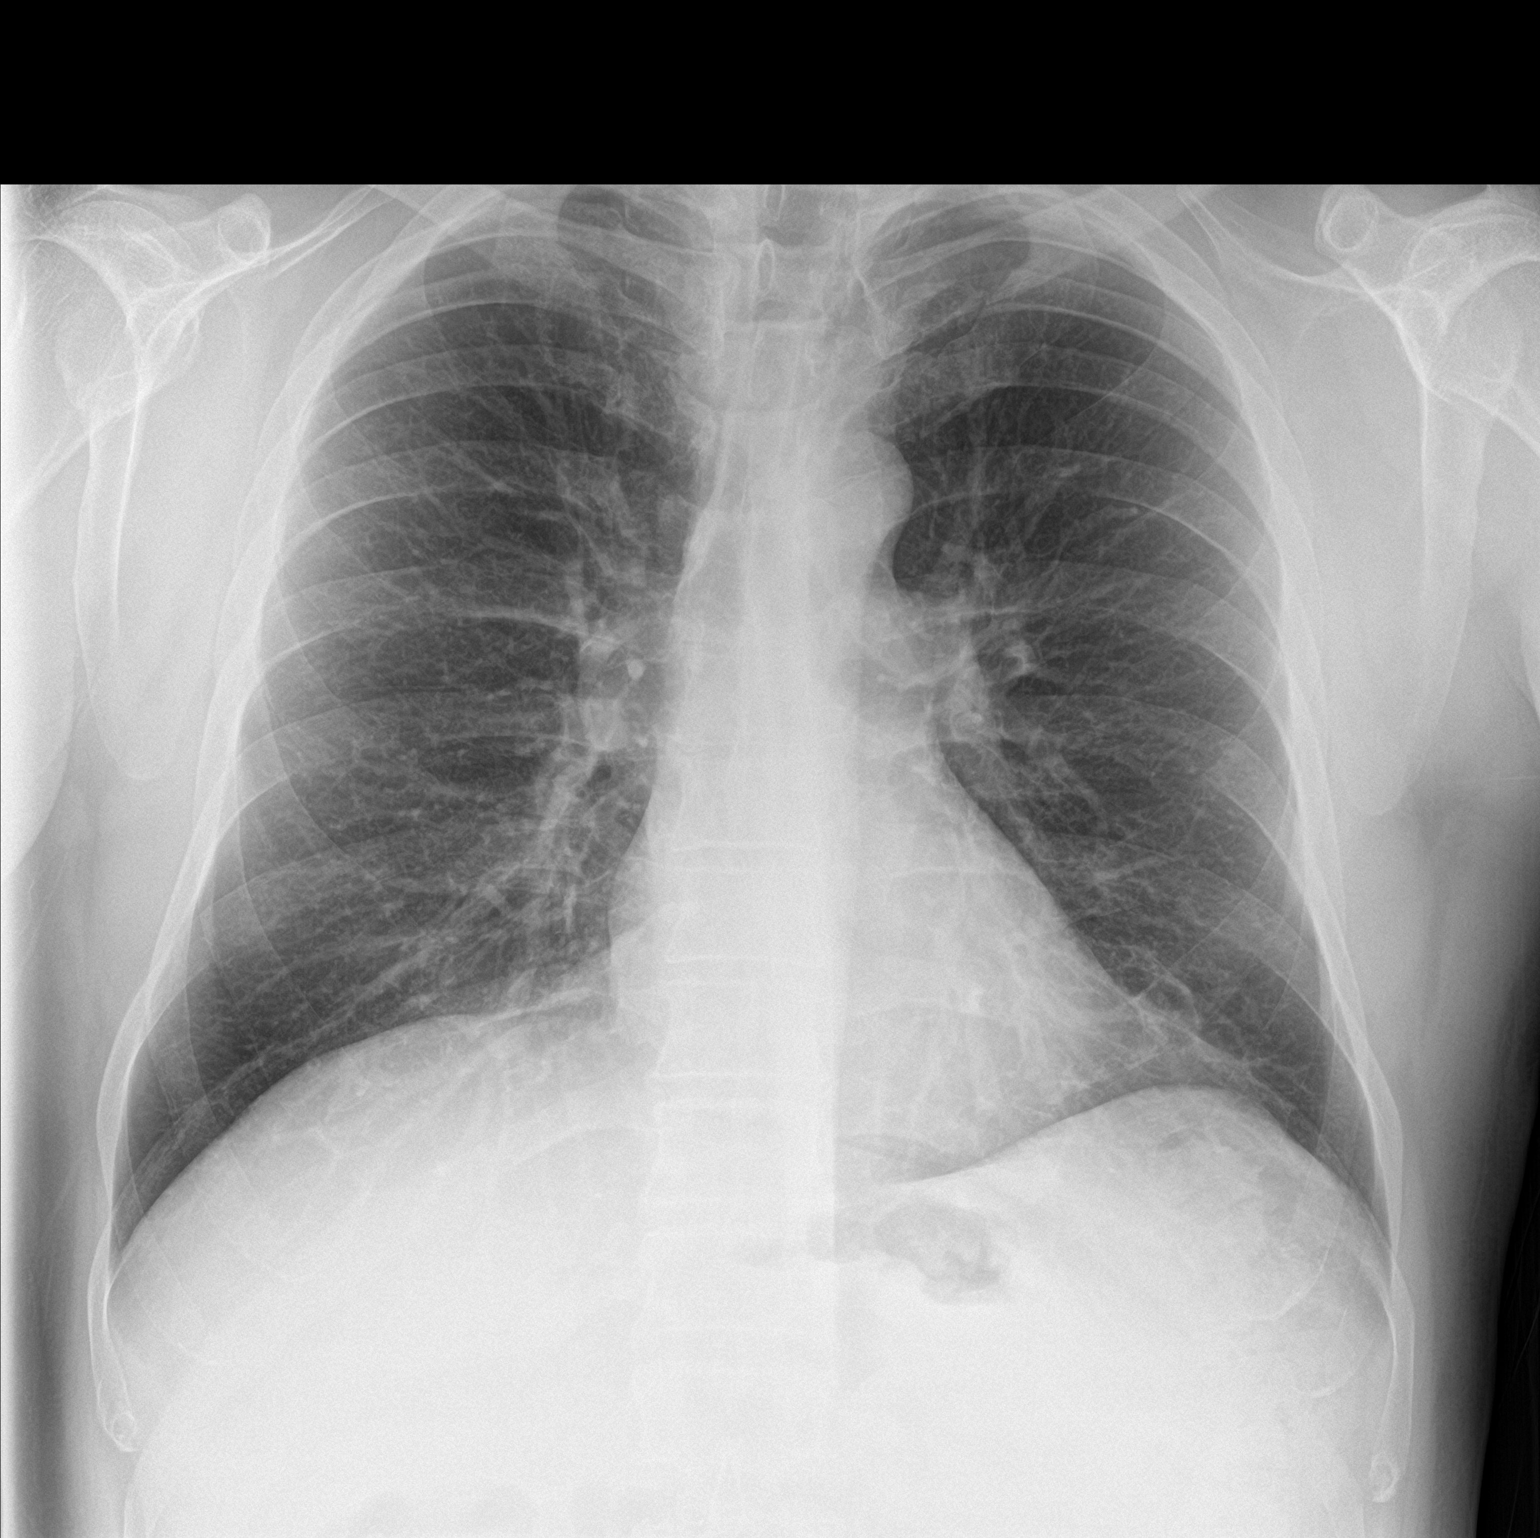

[chest lat]
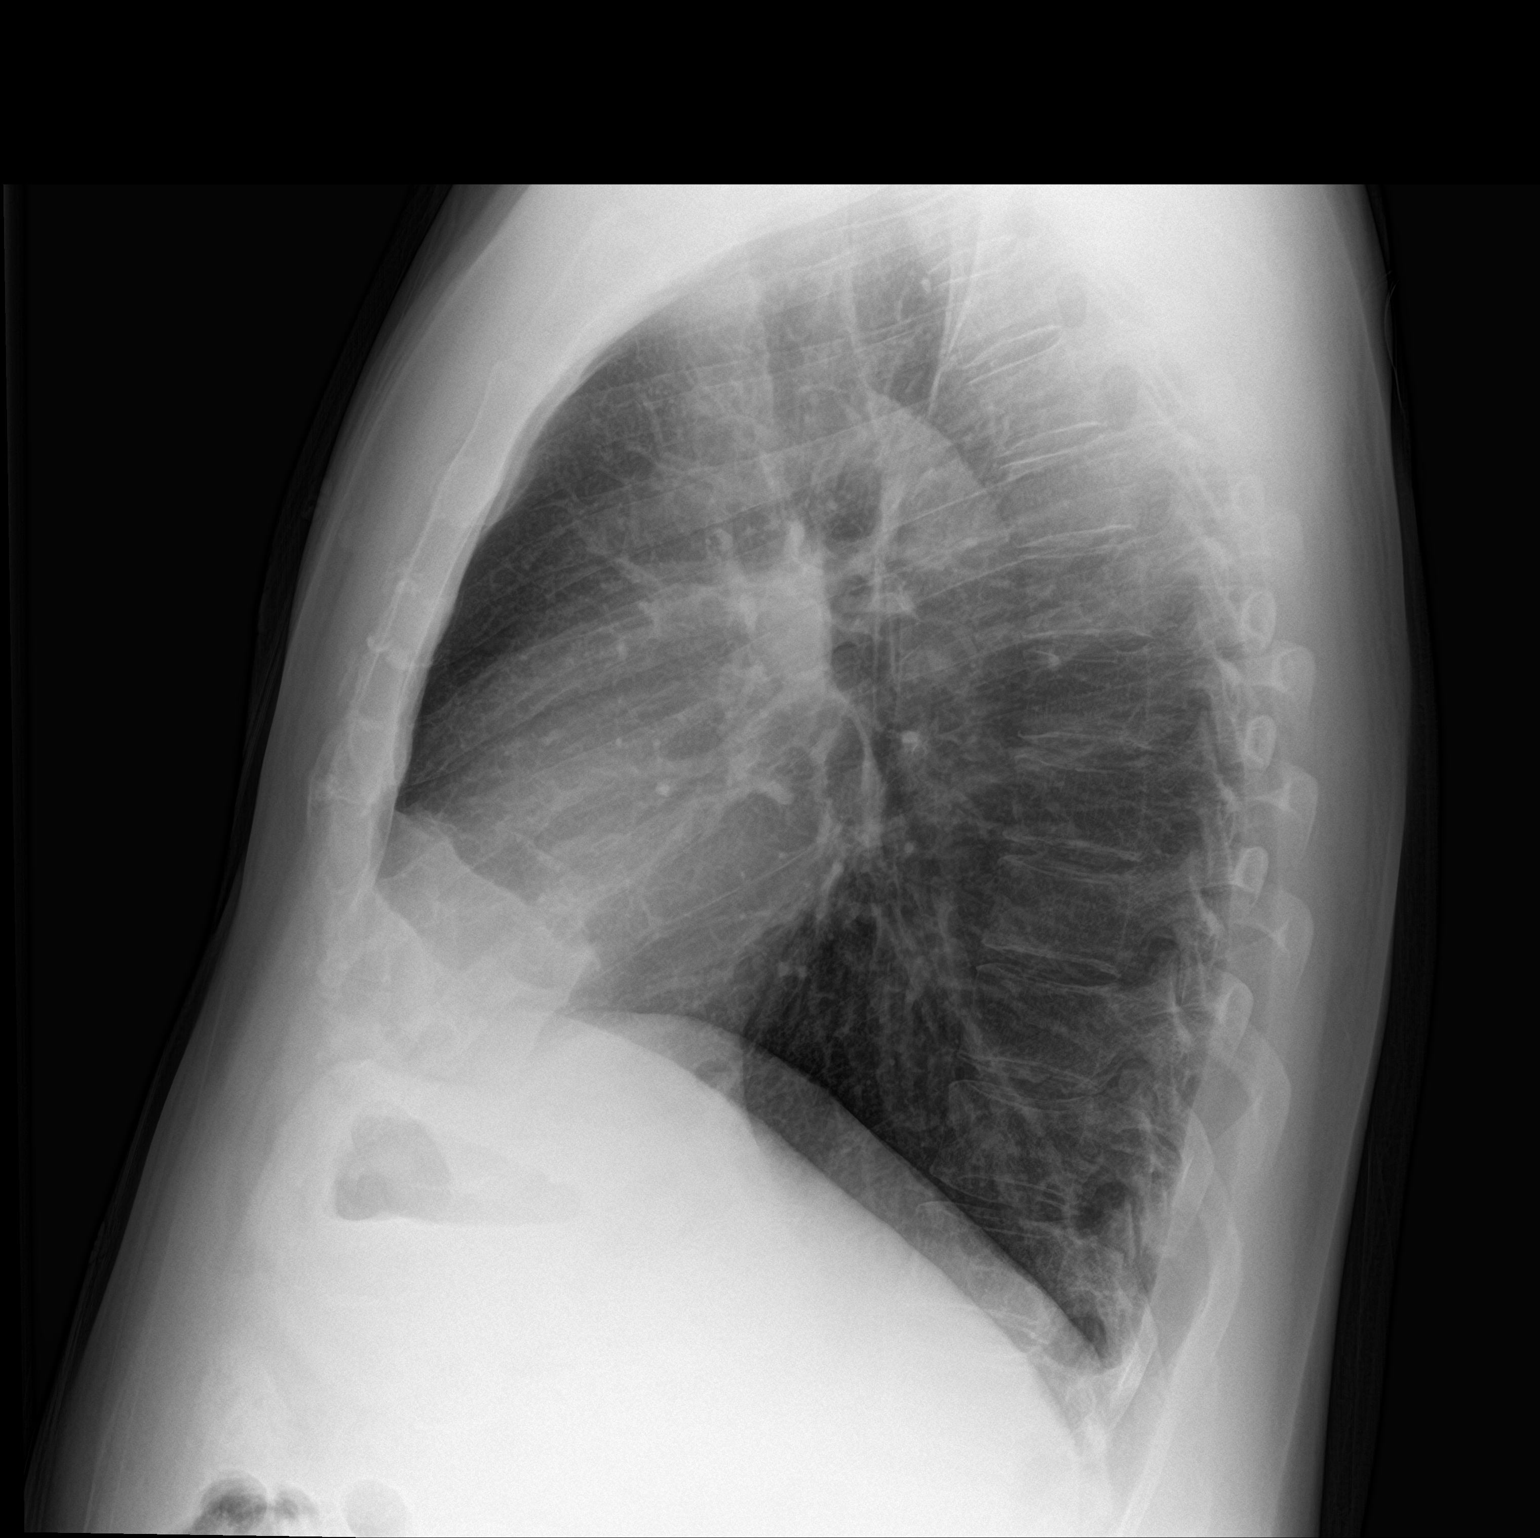

[2 of 2 positions shown; findings below may reference images not displayed]

FINDINGS: The heart size and mediastinal contours are within normal limits.
Both lungs are clear. The visualized skeletal structures are
unremarkable.
IMPRESSION: No active cardiopulmonary disease.

## 2020-08-29 ENCOUNTER — Other Ambulatory Visit: Payer: Self-pay

## 2020-08-29 ENCOUNTER — Ambulatory Visit (INDEPENDENT_AMBULATORY_CARE_PROVIDER_SITE_OTHER): Payer: BC Managed Care – PPO | Admitting: Family Medicine

## 2020-08-29 ENCOUNTER — Encounter: Payer: Self-pay | Admitting: Family Medicine

## 2020-08-29 VITALS — BP 106/64 | HR 79 | Temp 97.9°F | Resp 16 | Ht 71.75 in | Wt 212.4 lb

## 2020-08-29 DIAGNOSIS — Z0000 Encounter for general adult medical examination without abnormal findings: Secondary | ICD-10-CM

## 2020-08-29 DIAGNOSIS — Z125 Encounter for screening for malignant neoplasm of prostate: Secondary | ICD-10-CM | POA: Diagnosis not present

## 2020-08-29 DIAGNOSIS — Z1211 Encounter for screening for malignant neoplasm of colon: Secondary | ICD-10-CM | POA: Diagnosis not present

## 2020-08-29 NOTE — Patient Instructions (Signed)

## 2020-08-29 NOTE — Progress Notes (Signed)
Office Note 08/29/2020  CC:  Chief Complaint  Patient presents with  . Annual Exam    Pt is not fasting    HPI:  Andrew Cantu is a 55 y.o. White male who is here for annual health maintenance exam.  Exercise habits: not optimal, mainly walking. Says he is not disciplined regarding diet.  Past Medical History:  Diagnosis Date  . Anxiety    particularly about his health  . Discomfort in chest 2018/2019   R side: pt says he feels like there is something in his R lung.  He cannot be reassured that he is ok despite normal ESR, spirometry, and CXR-->therefore, Pulmonologit ordered CT chest 11/2017 but pt did not get this.  Dr. Melvyn Novas 03/2019->HRCT planned.  Upper airway cough syndrome suspected.  . Elevated serum creatinine    Cr 1.8 (eGFR 43).  Normal on f/u testing.  . Kidney stone 05/2017   Asymptomatic 8 mm nonobstructing stone in midportion of R kidney (stone vs small angiomyolipoma).  Plan--active surveillance with repeat renal u/s around 11/2017.  Marland Kitchen Upper airway cough syndrome 2021   Dr. Melvyn Novas.  Also, CT showed some changes c/w "centrilobular COPD"    Past Surgical History:  Procedure Laterality Date  . COLONOSCOPY  age 34   normal (done for heme + stool -->conclusion was due to oral/dental bleeding    Family History  Problem Relation Age of Onset  . Hypertension Father     Social History   Socioeconomic History  . Marital status: Married    Spouse name: Not on file  . Number of children: Not on file  . Years of education: Not on file  . Highest education level: Not on file  Occupational History  . Not on file  Tobacco Use  . Smoking status: Former Smoker    Packs/day: 0.50    Years: 30.00    Pack years: 15.00    Types: Cigarettes    Quit date: 02/27/2017    Years since quitting: 3.5  . Smokeless tobacco: Never Used  Vaping Use  . Vaping Use: Never used  Substance and Sexual Activity  . Alcohol use: Yes    Alcohol/week: 3.0 - 4.0 standard drinks    Types:  3 - 4 Glasses of wine per week  . Drug use: No  . Sexual activity: Not on file  Other Topics Concern  . Not on file  Social History Narrative   [name] daughter (adult).   Educ: Masters degree (BA--[employer] and also in Engineer, production).   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   Tob: 30 pack year hx.  Quit 2018.   Alc: occ wine   Orig from [place].   Social Determinants of Health   Financial Resource Strain: Not on file  Food Insecurity: Not on file  Transportation Needs: Not on file  Physical Activity: Not on file  Stress: Not on file  Social Connections: Not on file  Intimate Partner Violence: Not on file    Outpatient Medications Prior to Visit  Medication Sig Dispense Refill  . DERMA-SMOOTHE/FS SCALP 0.01 % OIL SMARTSIG:1 Sparingly Topical Twice a Week PRN (Patient not taking: Reported on 08/29/2020)    . famotidine (PEPCID) 20 MG tablet One after supper (Patient not taking: Reported on 08/29/2020) 30 tablet 11  . pantoprazole (PROTONIX) 40 MG tablet Take 1 tablet (40 mg total) by mouth daily. Take 30-60 min before first meal of the day (Patient not taking: Reported on 08/29/2020) 30 tablet 2  No facility-administered medications prior to visit.    No Known Allergies  ROS Review of Systems  Constitutional: Negative for appetite change, chills, fatigue and fever.  HENT: Negative for congestion, dental problem, ear pain and sore throat.   Eyes: Negative for discharge, redness and visual disturbance.  Respiratory: Negative for cough, chest tightness, shortness of breath and wheezing.   Cardiovascular: Negative for chest pain, palpitations and leg swelling.  Gastrointestinal: Negative for abdominal pain, blood in stool, diarrhea, nausea and vomiting.  Genitourinary: Negative for difficulty urinating, dysuria, flank pain, frequency, hematuria and urgency.  Musculoskeletal: Negative for arthralgias, back pain, joint swelling, myalgias and neck stiffness.  Skin:  Negative for pallor and rash.  Neurological: Negative for dizziness, speech difficulty, weakness and headaches.  Hematological: Negative for adenopathy. Does not bruise/bleed easily.  Psychiatric/Behavioral: Negative for confusion and sleep disturbance. The patient is not nervous/anxious.     PE; Vitals with BMI 08/29/2020 08/05/2019 04/21/2019  Height 5' 11.75" 6\' 1"  6\' 1"   Weight 212 lbs 6 oz 207 lbs 203 lbs  BMI 29.02 28.31 51.76  Systolic 160 737 106  Diastolic 64 86 72  Pulse 79 67 66     Gen: Alert, well appearing.  Patient is oriented to person, place, time, and situation. AFFECT: pleasant, lucid thought and speech. ENT: Ears: EACs clear, normal epithelium.  TMs with good light reflex and landmarks bilaterally.  Eyes: no injection, icteris, swelling, or exudate.  EOMI, PERRLA. Nose: no drainage or turbinate edema/swelling.  No injection or focal lesion.  Mouth: lips without lesion/swelling.  Oral mucosa pink and moist.  Dentition intact and without obvious caries or gingival swelling.  Oropharynx without erythema, exudate, or swelling.  Neck: supple/nontender.  No LAD, mass, or TM.  Carotid pulses 2+ bilaterally, without bruits. CV: RRR, no m/r/g.   LUNGS: CTA bilat, nonlabored resps, good aeration in all lung fields. ABD: soft, NT, ND, BS normal.  No hepatospenomegaly or mass.  No bruits. EXT: no clubbing, cyanosis, or edema.  Musculoskeletal: no joint swelling, erythema, warmth, or tenderness.  ROM of all joints intact. Skin - no sores or suspicious lesions or rashes or color changes   Pertinent labs:  Lab Results  Component Value Date   TSH 2.59 04/03/2019   Lab Results  Component Value Date   WBC 4.2 04/03/2019   HGB 14.1 04/03/2019   HCT 42.4 04/03/2019   MCV 91.9 04/03/2019   PLT 295.0 04/03/2019   Lab Results  Component Value Date   CREATININE 0.86 04/03/2019   BUN 10 04/03/2019   NA 139 04/03/2019   K 4.8 04/03/2019   CL 104 04/03/2019   CO2 29 04/03/2019    Lab Results  Component Value Date   ALT 20 04/03/2019   AST 19 04/03/2019   ALKPHOS 52 04/03/2019   BILITOT 1.0 04/03/2019   Lab Results  Component Value Date   CHOL 222 (H) 04/03/2019   Lab Results  Component Value Date   HDL 58.90 04/03/2019   Lab Results  Component Value Date   LDLCALC 142 (H) 04/03/2019   Lab Results  Component Value Date   TRIG 104.0 04/03/2019   Lab Results  Component Value Date   CHOLHDL 4 04/03/2019   Lab Results  Component Value Date   PSA 0.83 04/03/2019    ASSESSMENT AND PLAN:   Health maintenance exam: Reviewed age and gender appropriate health maintenance issues (prudent diet, regular exercise, health risks of tobacco and excessive alcohol, use of seatbelts, fire  alarms in home, use of sunscreen).  Also reviewed age and gender appropriate health screening as well as vaccine recommendations. Vaccines: pt declined flu and Tdap.  Covid x2 but no booster yet. Labs: fasting HP labs ordered future. Prostate ca screening: PSA ordered future. Colon ca screening: due for initial screening, discussed options today->cologuard to be arranged.    An After Visit Summary was printed and given to the patient.  FOLLOW UP:  Return in about 1 year (around 08/29/2021) for annual CPE (fasting).  Signed:  Crissie Sickles, MD           08/29/2020

## 2020-08-29 NOTE — Addendum Note (Signed)
Addended by: Deveron Furlong D on: 08/29/2020 03:37 PM   Modules accepted: Orders

## 2020-08-30 DIAGNOSIS — E7800 Pure hypercholesterolemia, unspecified: Secondary | ICD-10-CM

## 2020-08-30 HISTORY — DX: Pure hypercholesterolemia, unspecified: E78.00

## 2020-09-02 ENCOUNTER — Ambulatory Visit: Payer: BC Managed Care – PPO

## 2020-09-07 ENCOUNTER — Ambulatory Visit (INDEPENDENT_AMBULATORY_CARE_PROVIDER_SITE_OTHER): Payer: BC Managed Care – PPO

## 2020-09-07 ENCOUNTER — Other Ambulatory Visit: Payer: Self-pay

## 2020-09-07 ENCOUNTER — Encounter: Payer: Self-pay | Admitting: Family Medicine

## 2020-09-07 DIAGNOSIS — Z125 Encounter for screening for malignant neoplasm of prostate: Secondary | ICD-10-CM | POA: Diagnosis not present

## 2020-09-07 DIAGNOSIS — Z0000 Encounter for general adult medical examination without abnormal findings: Secondary | ICD-10-CM

## 2020-09-07 LAB — CBC WITH DIFFERENTIAL/PLATELET
Basophils Absolute: 0 10*3/uL (ref 0.0–0.1)
Basophils Relative: 1.2 % (ref 0.0–3.0)
Eosinophils Absolute: 0.1 10*3/uL (ref 0.0–0.7)
Eosinophils Relative: 2.6 % (ref 0.0–5.0)
HCT: 39.9 % (ref 39.0–52.0)
Hemoglobin: 13.5 g/dL (ref 13.0–17.0)
Lymphocytes Relative: 31.2 % (ref 12.0–46.0)
Lymphs Abs: 1.1 10*3/uL (ref 0.7–4.0)
MCHC: 33.8 g/dL (ref 30.0–36.0)
MCV: 89.5 fl (ref 78.0–100.0)
Monocytes Absolute: 0.5 10*3/uL (ref 0.1–1.0)
Monocytes Relative: 12.6 % — ABNORMAL HIGH (ref 3.0–12.0)
Neutro Abs: 1.9 10*3/uL (ref 1.4–7.7)
Neutrophils Relative %: 52.4 % (ref 43.0–77.0)
Platelets: 267 10*3/uL (ref 150.0–400.0)
RBC: 4.46 Mil/uL (ref 4.22–5.81)
RDW: 13 % (ref 11.5–15.5)
WBC: 3.6 10*3/uL — ABNORMAL LOW (ref 4.0–10.5)

## 2020-09-07 LAB — COMPREHENSIVE METABOLIC PANEL
ALT: 19 U/L (ref 0–53)
AST: 16 U/L (ref 0–37)
Albumin: 4.3 g/dL (ref 3.5–5.2)
Alkaline Phosphatase: 62 U/L (ref 39–117)
BUN: 10 mg/dL (ref 6–23)
CO2: 29 mEq/L (ref 19–32)
Calcium: 9.4 mg/dL (ref 8.4–10.5)
Chloride: 105 mEq/L (ref 96–112)
Creatinine, Ser: 0.91 mg/dL (ref 0.40–1.50)
GFR: 97.02 mL/min (ref >60.00)
Glucose, Bld: 93 mg/dL (ref 70–99)
Potassium: 5 mEq/L (ref 3.5–5.1)
Sodium: 139 mEq/L (ref 135–145)
Total Bilirubin: 0.6 mg/dL (ref 0.2–1.2)
Total Protein: 6.7 g/dL (ref 6.0–8.3)

## 2020-09-07 LAB — TSH: TSH: 2.52 u[IU]/mL (ref 0.35–4.50)

## 2020-09-07 LAB — LIPID PANEL
Cholesterol: 236 mg/dL — ABNORMAL HIGH (ref 0–200)
HDL: 53.4 mg/dL (ref >39.00)
LDL Cholesterol: 157 mg/dL — ABNORMAL HIGH (ref 0–99)
NonHDL: 182.77
Total CHOL/HDL Ratio: 4
Triglycerides: 129 mg/dL (ref 0.0–149.0)
VLDL: 25.8 mg/dL (ref 0.0–40.0)

## 2020-09-07 LAB — PSA: PSA: 0.51 ng/mL (ref 0.10–4.00)

## 2020-09-23 LAB — COLOGUARD: Cologuard: POSITIVE — AB

## 2020-09-26 ENCOUNTER — Other Ambulatory Visit: Payer: Self-pay | Admitting: Family Medicine

## 2020-09-26 ENCOUNTER — Encounter: Payer: Self-pay | Admitting: Family Medicine

## 2020-09-26 DIAGNOSIS — R195 Other fecal abnormalities: Secondary | ICD-10-CM

## 2021-01-13 ENCOUNTER — Other Ambulatory Visit: Payer: Self-pay

## 2021-01-13 ENCOUNTER — Ambulatory Visit: Payer: BC Managed Care – PPO | Admitting: Primary Care

## 2021-01-13 ENCOUNTER — Encounter: Payer: Self-pay | Admitting: Primary Care

## 2021-01-13 VITALS — BP 116/72 | HR 77 | Temp 97.9°F | Ht 73.0 in | Wt 205.8 lb

## 2021-01-13 DIAGNOSIS — J479 Bronchiectasis, uncomplicated: Secondary | ICD-10-CM

## 2021-01-13 DIAGNOSIS — J432 Centrilobular emphysema: Secondary | ICD-10-CM | POA: Diagnosis not present

## 2021-01-13 NOTE — Progress Notes (Signed)
$'@Patient'I$  ID: Andrew Cantu, male    DOB: [birth year], 55 y.o.   MRN: [id number]  Chief Complaint  Patient presents with   Follow-up    Wanting clarification of diagnosis.     Referring provider: Tammi Sou, MD   Brief patient profile:  55 yo Andrew Cantu male exp to [place] as child and smoked until 03/11/17 when quit p father died "due to lungs" and subsequently w/in about a year onset of cough with sensation of a tickle above R breast but no excess mucus and eval by [facility] with nl cxr and ? nl pfts no recs so referred to pulmonary clinic 04/21/2019 by Dr   Anitra Lauth.   Previous [facility] pulmonary encounter: 04/21/2019  Pulmonary/ 1st office eval/[facility]  Chief Complaint  Patient presents with   Pulmonary Consult    Referred by Dr. Gertie Exon.  Pt c/o sensation of mucus "coming from right lung" for the past year.   Dyspnea:  2-3 x per day takes long walks/ does IT ex tol "not what it used to be"  Cough: constant urge to cough / worse p wakes up > never produces any mucus "always just swallow it'  Sleep: fine R side down/ cough does not disturb sleep   SABA use: no / using lots of mints/ cough drops  rec Most likely you have a mild form of bronchiectasis which can only be diagnosed reliably with a High Resolution CT  Bronchiecatasis means that the muciliary escalator is not working  GERD diet   08/05/2019  f/u ov/[facility] re: emphysema on CT - no bronchiectasis  Chief Complaint  Patient presents with   Follow-up    Pt states that the sensation of mucus coming from his right lung seems more consistant. Here to discuss.   Dyspnea:  Can still job but only about a quarter of a mile / MMRC1 = can walk nl pace, flat grade, can't hurry or go uphills or steps s sob   Cough: never productive Sleeping: R side down, two pillows  SABA use: none  02: none    No obvious day to day or daytime variability or assoc excess/ purulent sputum or mucus plugs or hemoptysis or cp or chest tightness,  subjective wheeze or overt sinus or hb symptoms.   Sleeping as above  without nocturnal  or early am exacerbation  of respiratory  c/o's or need for noct saba. Also denies any obvious fluctuation of symptoms with weather or environmental changes or other aggravating or alleviating factors except as outlined above   No unusual exposure hx or h/o childhood pna/ asthma or knowledge of premature birth.  Current Allergies, Complete Past Medical History, Past Surgical History, Family History, and Social History were reviewed in [employer] record.   01/13/2021- Interim hx  Patient presents today for regular follow-up. He feels well, no acute respiratory complaints. He has not had any mucus production or shortness of breath. Occasionally needs to clear his throat. HRCT in Nov 2020 showed mild cenlobular emphysema, mild cylindrical bronchiectasis. No ILD. Scattered nodular densities measurie up to 46mm, some felt to reflect mucoid impaction. He needs Alpha 1 level checked and PFTs.     No Known Allergies  Immunization History  Administered Date(s) Administered   Influenza-Unspecified 11-Mar-2018, 03/06/2019   Janssen (J&J) SARS-COV-2 Vaccination 10/09/2019    Past Medical History:  Diagnosis Date   Anxiety    particularly about his health   Borderline hypercholesterolemia 08/2020   2022 frmhm 59yr cv  risk=5%-->TLC   Discomfort in chest 2018/2019   R side: pt says he feels like there is something in his R lung.  He cannot be reassured that he is ok despite normal ESR, spirometry, and CXR-->therefore, Pulmonologit ordered CT chest 11/2017 but pt did not get this.  Dr. Melvyn Novas 03/2019->HRCT planned.  Upper airway cough syndrome suspected.   Elevated serum creatinine    Cr 1.8 (eGFR 43).  Normal on f/u testing.   Kidney stone 05/2017   Asymptomatic 8 mm nonobstructing stone in midportion of R kidney (stone vs small angiomyolipoma).  Plan--active surveillance with repeat renal u/s  around 11/2017.   Upper airway cough syndrome 2021   Dr. Melvyn Novas.  Also, CT showed some changes c/w "centrilobular COPD"    Tobacco History: Social History   Tobacco Use  Smoking Status Former   Packs/day: 0.50   Years: 30.00   Pack years: 15.00   Types: Cigarettes   Quit date: 02/27/2017   Years since quitting: 3.8  Smokeless Tobacco Never   Counseling given: Not Answered   No outpatient medications prior to visit.   No facility-administered medications prior to visit.    Review of Systems  Review of Systems  Constitutional: Negative.   HENT: Negative.  Negative for congestion and postnasal drip.   Respiratory:  Negative for cough, chest tightness and wheezing.        Throat clearing   Cardiovascular: Negative.     Physical Exam  BP 116/72 (BP Location: Left Arm, Cuff Size: Normal)   Pulse 77   Temp 97.9 F (36.6 C) (Temporal)   Ht $R'6\' 1"'jp$  (1.854 m)   Wt 205 lb 12.8 oz (93.4 kg)   SpO2 100% Comment: RA  BMI 27.15 kg/m  Physical Exam Constitutional:      Appearance: Normal appearance.  HENT:     Head: Normocephalic and atraumatic.     Mouth/Throat:     Mouth: Mucous membranes are moist.     Pharynx: Oropharynx is clear.  Cardiovascular:     Rate and Rhythm: Normal rate and regular rhythm.  Pulmonary:     Effort: Pulmonary effort is normal.     Breath sounds: Normal breath sounds.     Comments: CTA Musculoskeletal:        General: Normal range of motion.  Skin:    General: Skin is warm and dry.  Neurological:     General: No focal deficit present.     Mental Status: He is alert and oriented to person, place, and time. Mental status is at baseline.  Psychiatric:        Mood and Affect: Mood normal.        Behavior: Behavior normal.        Thought Content: Thought content normal.        Judgment: Judgment normal.     Lab Results:  CBC    Component Value Date/Time   WBC 3.6 (L) 09/07/2020 0842   RBC 4.46 09/07/2020 0842   HGB 13.5 09/07/2020 0842    HCT 39.9 09/07/2020 0842   PLT 267.0 09/07/2020 0842   MCV 89.5 09/07/2020 0842   MCH 30.1 06/07/2017 1512   MCHC 33.8 09/07/2020 0842   RDW 13.0 09/07/2020 0842   LYMPHSABS 1.1 09/07/2020 0842   MONOABS 0.5 09/07/2020 0842   EOSABS 0.1 09/07/2020 0842   BASOSABS 0.0 09/07/2020 0842    BMET    Component Value Date/Time   NA 139 09/07/2020 0842   K 5.0 09/07/2020  0842   CL 105 09/07/2020 0842   CO2 29 09/07/2020 0842   GLUCOSE 93 09/07/2020 0842   BUN 10 09/07/2020 0842   CREATININE 0.91 09/07/2020 0842   CREATININE 1.88 (H) 06/07/2017 1512   CALCIUM 9.4 09/07/2020 0842    BNP No results found for: BNP  ProBNP No results found for: PROBNP  Imaging: No results found.   Assessment & Plan:   Centrilobular emphysema ([place]) on CT - HRCT in Nov 2020 showed mild cenlobular emphysema and Mild air trapping. Scattered tiny pulmonary nodules. No ILD - Needs Alpha 1 phenotype and PFTs - FU in 6 months with Dr. Melvyn Novas  Bronchiectasis without complication ([place]) - No acute symptoms. Denies chest congestion ro productive cough. He had mild bronchiectasis on imaging  - Encourage patient use Flutter valve 2-3 times a day for pulmonary hygiene - He can take Mucinex 600-$RemoveBeforeDE'1200mg'YOhgnwarBLzGMlB$  BID as needed for chest congestion/loosen phlegm - Patient to notify office if he developed productive cough with purulent mucus      [name], NP 01/13/2021

## 2021-01-13 NOTE — Assessment & Plan Note (Addendum)
-   HRCT in Nov 2020 showed mild cenlobular emphysema and Mild air trapping. Scattered tiny pulmonary nodules. No ILD - Needs Alpha 1 phenotype and PFTs - FU in 2 weeks with Beth NP to review testing results and 6 months with Dr. Melvyn Novas

## 2021-01-13 NOTE — Assessment & Plan Note (Signed)
-   No acute symptoms. Denies chest congestion ro productive cough. He had mild bronchiectasis on imaging  - Encourage patient use Flutter valve 2-3 times a day for pulmonary hygiene - He can take Mucinex 600-1200mg  BID as needed for chest congestion/loosen phlegm - Patient to notify office if he developed productive cough with purulent mucus

## 2021-01-13 NOTE — Patient Instructions (Addendum)
Recommendations: - Use flutter valve 2-3 times a day  - Take Mucinex 600-1200mg  twice a day as needed for chest congestion/loosen phlegm  - Call our office if you develop cough with mucus production   Orders: - Labs today (alpha 1) - PFTs first available  Follow-up: - 2 week virtual visit with Beth NP to review testing  - 6 months with Dr. Melvyn Novas    Bronchiectasis  Bronchiectasis is a condition in which the airways in the lungs (bronchi) are damaged and widened. The condition makes it hard for the lungs to get rid of mucus, and it causes mucus to gather in the bronchi. This condition oftenleads to lung infections, which can make the condition worse. What are the causes? You can be born with this condition or you can develop it later in life. Common causes of this condition include: Cystic fibrosis. Repeated lung infections, such as pneumonia or tuberculosis. An object or other blockage in the lungs. Breathing in fluid, food, or other objects (aspiration). A problem with the immune system and lung structure that is present at birth (congenital). Sometimes the cause is not known. What are the signs or symptoms? Common symptoms of this condition include: A daily cough that brings up mucus and lasts for more than 3 weeks. Lung infections that happen often. Shortness of breath and wheezing. Weakness and fatigue. How is this diagnosed? This condition is diagnosed with tests, such as: Chest X-rays or CT scans. These are done to check for changes in the lungs. Breathing tests. These are done to check how well your lungs are working. A test of a sample of your saliva (sputum culture). This test is done to check for infection. Blood tests and other tests. These are done to check for related diseases or causes. How is this treated? Treatment for this condition depends on the severity of the illness and its cause. Treatment may include: Medicines that loosen mucus so it can be coughed up  (mucolytics). Medicines that relax the muscles of the bronchi (bronchodilators). Antibiotic medicines to prevent or treat infection. Physical therapy to help clear mucus from the lungs. Techniques may include: Postural drainage. This is when you sit or lie in certain positions so that mucus can drain by gravity. Chest percussion. This involves tapping the chest or back with a cupped hand. Chest vibration. For this therapy, a hand or special equipment vibrates your chest and back. Surgery to remove the affected part of the lung. This may be done in severe cases. Follow these instructions at home: Medicines Take over-the-counter and prescription medicines only as told by your health care provider. If you were prescribed an antibiotic medicine, take it as told by your health care provider. Do not stop taking the antibiotic even if you start to feel better. Avoid taking sedatives and antihistamines unless your health care provider tells you to take them. These medicines tend to thicken the mucus in the lungs. Managing symptoms Perform breathing exercises or techniques to clear your lungs as told by your health care provider. Consider using a cold steam vaporizer or humidifier in your room or home to help loosen secretions. If you have a cough that gets worse at night, try sleeping in a semi-upright position. General instructions Get plenty of rest. Drink enough fluid to keep your urine clear or pale yellow. Stay inside when pollution and ozone levels are high. Stay up to date with vaccinations and immunizations. Avoid cigarette smoke and other lung irritants. Do not use any products  that contain nicotine or tobacco, such as cigarettes and e-cigarettes. If you need help quitting, ask your health care provider. Keep all follow-up visits as told by your health care provider. This is important. Contact a health care provider if: You cough up more sputum than before and the sputum is yellow or green  in color. You have a fever. You cannot control your cough and are losing sleep. Get help right away if: You cough up blood. You have chest pain. You have increasing shortness of breath. You have pain that gets worse or is not controlled with medicines. You have a fever and your symptoms suddenly get worse. Summary Bronchiectasis is a condition in which the airways in the lungs (bronchi) are damaged and widened. The condition makes it hard for the lungs to get rid of mucus, and it causes mucus to gather in the bronchi. Treatment usually includes therapy to help clear mucus from the lungs. Avoid cigarette smoke and other lung irritants. Stay up to date with vaccinations and immunizations. This information is not intended to replace advice given to you by your health care provider. Make sure you discuss any questions you have with your healthcare provider. Document Revised: 03/17/2020 Document Reviewed: 03/17/2020 Elsevier Patient Education  2022 [employer].

## 2021-01-19 ENCOUNTER — Other Ambulatory Visit: Payer: Self-pay

## 2021-01-19 ENCOUNTER — Ambulatory Visit (INDEPENDENT_AMBULATORY_CARE_PROVIDER_SITE_OTHER): Payer: BC Managed Care – PPO | Admitting: Internal Medicine

## 2021-01-19 DIAGNOSIS — J432 Centrilobular emphysema: Secondary | ICD-10-CM

## 2021-01-19 LAB — PULMONARY FUNCTION TEST
DL/VA % pred: 108 %
DL/VA: 4.72 ml/min/mmHg/L
DLCO cor % pred: 114 %
DLCO cor: 36.5 ml/min/mmHg
DLCO unc % pred: 114 %
DLCO unc: 36.5 ml/min/mmHg
FEF 25-75 Post: 4.86 L/sec
FEF 25-75 Pre: 5.66 L/sec
FEF2575-%Change-Post: -14 %
FEF2575-%Pred-Post: 132 %
FEF2575-%Pred-Pre: 154 %
FEV1-%Change-Post: -1 %
FEV1-%Pred-Post: 112 %
FEV1-%Pred-Pre: 114 %
FEV1-Post: 4.79 L
FEV1-Pre: 4.86 L
FEV1FVC-%Change-Post: -1 %
FEV1FVC-%Pred-Pre: 106 %
FEV6-%Change-Post: 1 %
FEV6-%Pred-Post: 109 %
FEV6-%Pred-Pre: 107 %
FEV6-Post: 5.83 L
FEV6-Pre: 5.75 L
FEV6FVC-%Change-Post: 0 %
FEV6FVC-%Pred-Post: 103 %
FEV6FVC-%Pred-Pre: 103 %
FVC-%Change-Post: 0 %
FVC-%Pred-Post: 106 %
FVC-%Pred-Pre: 106 %
FVC-Post: 5.86 L
FVC-Pre: 5.89 L
Post FEV1/FVC ratio: 82 %
Post FEV6/FVC ratio: 99 %
Pre FEV1/FVC ratio: 83 %
Pre FEV6/FVC Ratio: 99 %
RV % pred: 162 %
RV: 3.64 L
TLC % pred: 129 %
TLC: 9.81 L

## 2021-01-19 NOTE — Progress Notes (Signed)
Full PFT completed today ? ?

## 2021-01-24 ENCOUNTER — Telehealth: Payer: BC Managed Care – PPO | Admitting: Primary Care

## 2021-01-24 ENCOUNTER — Encounter: Payer: Self-pay | Admitting: Primary Care

## 2021-01-24 ENCOUNTER — Ambulatory Visit: Payer: BC Managed Care – PPO | Admitting: Primary Care

## 2021-01-24 DIAGNOSIS — J432 Centrilobular emphysema: Secondary | ICD-10-CM | POA: Diagnosis not present

## 2021-01-24 NOTE — Progress Notes (Signed)
Virtual Visit via Video Note  I connected with Andrew Cantu on 01/24/21 at 11:00 AM EDT by a video enabled telemedicine application and verified that I am speaking with the correct person using two identifiers.  Location: Patient: Home Provider: Office    I discussed the limitations of evaluation and management by telemedicine and the availability of in person appointments. The patient expressed understanding and agreed to proceed.   Brief patient profile:  55 yo [place] male exp to [place] as child and smoked until 2017/03/26 when quit p father died "due to lungs" and subsequently w/in about a year onset of cough with sensation of a tickle above R breast but no excess mucus and eval by [facility] with nl cxr and ? nl pfts no recs so referred to pulmonary clinic 04/21/2019 by Dr   Anitra Lauth.  Previous [facility] pulmonary encounter:  04/21/2019  Pulmonary/ 1st office eval/[facility]  Chief Complaint  Patient presents with   Pulmonary Consult    Referred by Dr. Gertie Exon.  Pt c/o sensation of mucus "coming from right lung" for the past year.   Dyspnea:  2-3 x per day takes long walks/ does IT ex tol "not what it used to be"  Cough: constant urge to cough / worse p wakes up > never produces any mucus "always just swallow it'  Sleep: fine R side down/ cough does not disturb sleep   SABA use: no / using lots of mints/ cough drops  rec Most likely you have a mild form of bronchiectasis which can only be diagnosed reliably with a High Resolution CT  Bronchiecatasis means that the muciliary escalator is not working  GERD diet   08/05/2019  f/u ov/[facility] re: emphysema on CT - no bronchiectasis   Chief Complaint  Patient presents with   Follow-up    Pt states that the sensation of mucus coming from his right lung seems more consistant. Here to discuss.   Dyspnea:  Can still job but only about a quarter of a mile / MMRC1 = can walk nl pace, flat grade, can't hurry or go uphills or steps s sob   Cough:  never productive Sleeping: R side down, two pillows  SABA use: none  02: none   No obvious day to day or daytime variability or assoc excess/ purulent sputum or mucus plugs or hemoptysis or cp or chest tightness, subjective wheeze or overt sinus or hb symptoms.   Sleeping as above  without nocturnal  or early am exacerbation  of respiratory  c/o's or need for noct saba. Also denies any obvious fluctuation of symptoms with weather or environmental changes or other aggravating or alleviating factors except as outlined above   No unusual exposure hx or h/o childhood pna/ asthma or knowledge of premature birth.  01/13/2021- NP, [name] Patient presents today for regular follow-up. He feels well, no acute respiratory complaints. He has not had any mucus production or shortness of breath. Occasionally needs to clear his throat. HRCT in Nov 2020 showed mild centrilobular emphysema, mild cylindrical bronchiectasis. No ILD. Scattered nodular densities measure up to 38mm, some felt to reflect mucoid impaction. He needs Alpha 1 level checked and PFTs.    01/24/2021- Interim Patient presents today for 2 week virtual video visit. Patient has hx bronchiectasis and mild emphysema. Former smoker 20 pack year hx. During last visit ordered for PFTs and Alpha-1 level. Advised he use flutter valve 2-3 times a day and take Mucinex as needed to loosen congestion. He is  doing well, no acute complaints. Reviewed testing. Pulmonary function testing showed normal spirometry and diffusion capacity. Alpha-1 level is still pending.    Observations/Objective:  - Appears well, no shortness of breath, wheezing or cough   Pulmonary function testing: 01/19/21- FVC 5.86 (106%), FEV1 4.79 (112%), ratio 82. TLC 129%, DLCOunc 114%  Normal spirometry and diffusion capacity   Assessment and Plan:  Centrilobular emphysema: - Former smoker. He remains asymptomatic without respiratory complaints  - CT imaging 06/05/19 showed mild  centrilobular emphysema, mild air trapping  - No obstruction on PFTs or diffusion defect, awaiting Alpha-1 level  - Encourage patient continue to abstain from cigarettes  Mild bronchiectasis: - No evidence of ILD on imaging   - Advised he use flutter valve 2-3 times a day and take Mucinex 600-1200mg  BID as needed to loosen congestion  Follow Up Instructions:  -1 year with Dr. Melvyn Novas or sooner as needed   I discussed the assessment and treatment plan with the patient. The patient was provided an opportunity to ask questions and all were answered. The patient agreed with the plan and demonstrated an understanding of the instructions.   The patient was advised to call back or seek an in-person evaluation if the symptoms worsen or if the condition fails to improve as anticipated.  I provided 24 minutes of non-face-to-face time during this encounter.   [name], NP

## 2021-01-24 NOTE — Patient Instructions (Signed)
Emphysema: - From prior smoking history. No evidence of COPD on pulmonary function testing - Continue to abstain from cigarettes - If you develop shortness of breath. Wheezing or productive cough notify office    Bronchiectasis: - Use flutter valve 3 times a day and take Mucinex 600-1200mg  twice a day as needed to loosen congestion  Follow-up: 1 year with Dr. Melvyn Novas or sooner if needed

## 2021-01-31 LAB — ALPHA-1 ANTITRYPSIN PHENOTYPE: A-1 Antitrypsin, Ser: 124 mg/dL (ref 83–199)

## 2021-02-01 NOTE — Progress Notes (Signed)
Alpha 1 phenotype was normal and level was within normal limits

## 2021-02-02 ENCOUNTER — Encounter: Payer: Self-pay | Admitting: *Deleted

## 2021-08-29 ENCOUNTER — Other Ambulatory Visit: Payer: Self-pay

## 2021-08-30 ENCOUNTER — Encounter: Payer: Self-pay | Admitting: Family Medicine

## 2021-08-30 ENCOUNTER — Encounter: Payer: Self-pay | Admitting: Gastroenterology

## 2021-08-30 ENCOUNTER — Ambulatory Visit (INDEPENDENT_AMBULATORY_CARE_PROVIDER_SITE_OTHER): Payer: BC Managed Care – PPO | Admitting: Family Medicine

## 2021-08-30 VITALS — BP 104/67 | HR 60 | Temp 97.7°F | Ht 73.5 in | Wt 199.4 lb

## 2021-08-30 DIAGNOSIS — Z1159 Encounter for screening for other viral diseases: Secondary | ICD-10-CM

## 2021-08-30 DIAGNOSIS — Z0000 Encounter for general adult medical examination without abnormal findings: Secondary | ICD-10-CM | POA: Diagnosis not present

## 2021-08-30 DIAGNOSIS — E538 Deficiency of other specified B group vitamins: Secondary | ICD-10-CM

## 2021-08-30 DIAGNOSIS — E7800 Pure hypercholesterolemia, unspecified: Secondary | ICD-10-CM | POA: Diagnosis not present

## 2021-08-30 DIAGNOSIS — Z114 Encounter for screening for human immunodeficiency virus [HIV]: Secondary | ICD-10-CM

## 2021-08-30 DIAGNOSIS — Z1211 Encounter for screening for malignant neoplasm of colon: Secondary | ICD-10-CM | POA: Diagnosis not present

## 2021-08-30 DIAGNOSIS — Z125 Encounter for screening for malignant neoplasm of prostate: Secondary | ICD-10-CM | POA: Diagnosis not present

## 2021-08-30 DIAGNOSIS — R195 Other fecal abnormalities: Secondary | ICD-10-CM

## 2021-08-30 DIAGNOSIS — D649 Anemia, unspecified: Secondary | ICD-10-CM

## 2021-08-30 HISTORY — DX: Deficiency of other specified B group vitamins: E53.8

## 2021-08-30 HISTORY — DX: Anemia, unspecified: D64.9

## 2021-08-30 LAB — LIPID PANEL
Cholesterol: 208 mg/dL — ABNORMAL HIGH (ref 0–200)
HDL: 71.4 mg/dL (ref >39.00)
LDL Cholesterol: 121 mg/dL — ABNORMAL HIGH (ref 0–99)
NonHDL: 136.7
Total CHOL/HDL Ratio: 3
Triglycerides: 77 mg/dL (ref 0.0–149.0)
VLDL: 15.4 mg/dL (ref 0.0–40.0)

## 2021-08-30 LAB — COMPREHENSIVE METABOLIC PANEL
ALT: 17 U/L (ref 0–53)
AST: 21 U/L (ref 0–37)
Albumin: 4.4 g/dL (ref 3.5–5.2)
Alkaline Phosphatase: 50 U/L (ref 39–117)
BUN: 12 mg/dL (ref 6–23)
CO2: 29 mEq/L (ref 19–32)
Calcium: 9.1 mg/dL (ref 8.4–10.5)
Chloride: 104 mEq/L (ref 96–112)
Creatinine, Ser: 0.99 mg/dL (ref 0.40–1.50)
GFR: 87.09 mL/min (ref >60.00)
Glucose, Bld: 96 mg/dL (ref 70–99)
Potassium: 4.6 mEq/L (ref 3.5–5.1)
Sodium: 137 mEq/L (ref 135–145)
Total Bilirubin: 0.9 mg/dL (ref 0.2–1.2)
Total Protein: 6.6 g/dL (ref 6.0–8.3)

## 2021-08-30 LAB — PSA: PSA: 0.54 ng/mL (ref 0.10–4.00)

## 2021-08-30 LAB — CBC WITH DIFFERENTIAL/PLATELET
Basophils Absolute: 0 10*3/uL (ref 0.0–0.1)
Basophils Relative: 0.9 % (ref 0.0–3.0)
Eosinophils Absolute: 0.1 10*3/uL (ref 0.0–0.7)
Eosinophils Relative: 2 % (ref 0.0–5.0)
HCT: 38.1 % — ABNORMAL LOW (ref 39.0–52.0)
Hemoglobin: 12.5 g/dL — ABNORMAL LOW (ref 13.0–17.0)
Lymphocytes Relative: 24.7 % (ref 12.0–46.0)
Lymphs Abs: 0.8 10*3/uL (ref 0.7–4.0)
MCHC: 32.7 g/dL (ref 30.0–36.0)
MCV: 91 fl (ref 78.0–100.0)
Monocytes Absolute: 0.3 10*3/uL (ref 0.1–1.0)
Monocytes Relative: 8.9 % (ref 3.0–12.0)
Neutro Abs: 2 10*3/uL (ref 1.4–7.7)
Neutrophils Relative %: 63.5 % (ref 43.0–77.0)
Platelets: 236 10*3/uL (ref 150.0–400.0)
RBC: 4.19 Mil/uL — ABNORMAL LOW (ref 4.22–5.81)
RDW: 13.3 % (ref 11.5–15.5)
WBC: 3.2 10*3/uL — ABNORMAL LOW (ref 4.0–10.5)

## 2021-08-30 LAB — TSH: TSH: 2.13 u[IU]/mL (ref 0.35–5.50)

## 2021-08-30 NOTE — Progress Notes (Signed)
Office Note 08/30/2021  CC:  Chief Complaint  Patient presents with   Annual Exam    Pt is fasting   HPI:  Patient is a 56 y.o. male who is here for annual health maintenance exam.  Andrew Cantu is feeling well, exercising regularly, eating healthy.  Past Medical History:  Diagnosis Date   Anxiety    particularly about his health   Borderline hypercholesterolemia 08/2020   2022 frmhm 77yrcv risk=5%-->TLC   Discomfort in chest 2018/2019   R side: pt says he feels like there is something in his R lung.  He cannot be reassured that he is ok despite normal ESR, spirometry, and CXR-->therefore, Pulmonologit ordered CT chest 11/2017 but pt did not get this.  Dr. WMelvyn Novas9/2020->HRCT planned.  Upper airway cough syndrome suspected.   Elevated serum creatinine    Cr 1.8 (eGFR 43).  Normal on f/u testing.   Kidney stone 05/2017   Asymptomatic 8 mm nonobstructing stone in midportion of R kidney (stone vs small angiomyolipoma).  Plan--active surveillance with repeat renal u/s around 11/2017.   Upper airway cough syndrome 2021   Dr. WMelvyn Novas  Also, CT showed some changes c/w "centrilobular COPD"    Past Surgical History:  Procedure Laterality Date   COLONOSCOPY  age 56  normal (done for heme + stool -->conclusion was due to oral/dental bleeding    Family History  Problem Relation Age of Onset   Hypertension Father     Social History   Socioeconomic History   Marital status: Married    Spouse name: Not on file   Number of children: Not on file   Years of education: Not on file   Highest education level: Not on file  Occupational History   Not on file  Tobacco Use   Smoking status: Former    Packs/day: 0.50    Years: 30.00    Pack years: 15.00    Types: Cigarettes    Quit date: 02/27/2017    Years since quitting: 4.5   Smokeless tobacco: Never  Vaping Use   Vaping Use: Never used  Substance and Sexual Activity   Alcohol use: Yes    Alcohol/week: 3.0 - 4.0 standard drinks     Types: 3 - 4 Glasses of wine per week   Drug use: No   Sexual activity: Not on file  Other Topics Concern   Not on file  Social History Narrative   MShanon Rosserdaughter (adult).   Educ: Masters degree (BA--[employer] and also in EEngineer, production.   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   Tob: 30 pack year hx.  Quit 2018.   Alc: occ wine   [place]   Social Determinants of Health   Financial Resource Strain: Not on file  Food Insecurity: Not on file  Transportation Needs: Not on file  Physical Activity: Not on file  Stress: Not on file  Social Connections: Not on file  Intimate Partner Violence: Not on file    No outpatient medications prior to visit.   No facility-administered medications prior to visit.    No Known Allergies  ROS Review of Systems  Constitutional:  Negative for appetite change, chills, fatigue and fever.  HENT:  Negative for congestion, dental problem, ear pain and sore throat.   Eyes:  Negative for discharge, redness and visual disturbance.  Respiratory:  Negative for cough, chest tightness, shortness of breath and wheezing.   Cardiovascular:  Negative for chest pain, palpitations and leg swelling.  Gastrointestinal:  Negative for abdominal pain, blood in stool, diarrhea, nausea and vomiting.  Genitourinary:  Negative for difficulty urinating, dysuria, flank pain, frequency, hematuria and urgency.  Musculoskeletal:  Negative for arthralgias, back pain, joint swelling, myalgias and neck stiffness.  Skin:  Negative for pallor and rash.  Neurological:  Negative for dizziness, speech difficulty, weakness and headaches.  Hematological:  Negative for adenopathy. Does not bruise/bleed easily.  Psychiatric/Behavioral:  Negative for confusion and sleep disturbance. The patient is not nervous/anxious.    PE; Vitals with BMI 08/30/2021 01/13/2021 08/29/2020  Height 6' 1.5" _0  5' 11.75"  Weight 199 lbs 6 oz 205 lbs 13 oz 212 lbs 6 oz  BMI 25.95  09.60 45.40  Systolic 981 191 478  Diastolic 67 72 64  Pulse 60 77 79    Gen: Alert, well appearing.  Patient is oriented to person, place, time, and situation. AFFECT: pleasant, lucid thought and speech. ENT: Ears: EACs clear, normal epithelium.  TMs with good light reflex and landmarks bilaterally.  Eyes: no injection, icteris, swelling, or exudate.  EOMI, PERRLA. Nose: no drainage or turbinate edema/swelling.  No injection or focal lesion.  Mouth: lips without lesion/swelling.  Oral mucosa pink and moist.  Dentition intact and without obvious caries or gingival swelling.  Oropharynx without erythema, exudate, or swelling.  Neck: supple/nontender.  No LAD, mass, or TM.  Carotid pulses 2+ bilaterally, without bruits. CV: RRR, no m/r/g.   LUNGS: CTA bilat, nonlabored resps, good aeration in all lung fields. ABD: soft, NT, ND, BS normal.  No hepatospenomegaly or mass.  No bruits. EXT: no clubbing, cyanosis, or edema.  Musculoskeletal: no joint swelling, erythema, warmth, or tenderness.  ROM of all joints intact. Skin - no sores or suspicious lesions or rashes or color changes  Pertinent labs:  Lab Results  Component Value Date   TSH 2.52 09/07/2020   Lab Results  Component Value Date   WBC 3.6 (L) 09/07/2020   HGB 13.5 09/07/2020   HCT 39.9 09/07/2020   MCV 89.5 09/07/2020   PLT 267.0 09/07/2020   Lab Results  Component Value Date   CREATININE 0.91 09/07/2020   BUN 10 09/07/2020   NA 139 09/07/2020   K 5.0 09/07/2020   CL 105 09/07/2020   CO2 29 09/07/2020   Lab Results  Component Value Date   ALT 19 09/07/2020   AST 16 09/07/2020   ALKPHOS 62 09/07/2020   BILITOT 0.6 09/07/2020   Lab Results  Component Value Date   CHOL 236 (H) 09/07/2020   Lab Results  Component Value Date   HDL 53.40 09/07/2020   Lab Results  Component Value Date   LDLCALC 157 (H) 09/07/2020   Lab Results  Component Value Date   TRIG 129.0 09/07/2020   Lab Results  Component Value  Date   CHOLHDL 4 09/07/2020   Lab Results  Component Value Date   PSA 0.51 09/07/2020   PSA 0.83 04/03/2019   ASSESSMENT AND PLAN:   Health maintenance exam: Reviewed age and gender appropriate health maintenance issues (prudent diet, regular exercise, health risks of tobacco and excessive alcohol, use of seatbelts, fire alarms in home, use of sunscreen).  Also reviewed age and gender appropriate health screening as well as vaccine recommendations. Vaccines: Tdap->declined.  Shingrix->deferred.  O/w UTD. Labs: health panel labs, HIV and hepatitis C screening, PSA. Prostate ca screening: PSA ordered. Colon ca screening: positive cologuard 08/2020-->referred to GI at that time->pt did not f/u on this so I'll  order again today.  An After Visit Summary was printed and given to the patient.  FOLLOW UP:  No follow-ups on file.  Signed:  Crissie Sickles, MD           08/30/2021

## 2021-08-30 NOTE — Patient Instructions (Signed)

## 2021-08-31 ENCOUNTER — Encounter: Payer: Self-pay | Admitting: Family Medicine

## 2021-08-31 ENCOUNTER — Other Ambulatory Visit (INDEPENDENT_AMBULATORY_CARE_PROVIDER_SITE_OTHER): Payer: BC Managed Care – PPO

## 2021-08-31 DIAGNOSIS — D72819 Decreased white blood cell count, unspecified: Secondary | ICD-10-CM

## 2021-08-31 DIAGNOSIS — D649 Anemia, unspecified: Secondary | ICD-10-CM | POA: Diagnosis not present

## 2021-08-31 LAB — HEPATITIS C ANTIBODY
Hepatitis C Ab: NONREACTIVE
SIGNAL TO CUT-OFF: <0.02 (ref <1.00)

## 2021-08-31 LAB — IBC + FERRITIN
Ferritin: 195.6 ng/mL (ref 22.0–322.0)
Iron: 98 ug/dL (ref 42–165)
Saturation Ratios: 27.6 % (ref 20.0–50.0)
TIBC: 355.6 ug/dL (ref 250.0–450.0)
Transferrin: 254 mg/dL (ref 212.0–360.0)

## 2021-08-31 LAB — VITAMIN B12: Vitamin B-12: 169 pg/mL — ABNORMAL LOW (ref 211–911)

## 2021-08-31 LAB — HIV ANTIBODY (ROUTINE TESTING W REFLEX): HIV 1&2 Ab, 4th Generation: NONREACTIVE

## 2021-09-01 ENCOUNTER — Telehealth: Payer: Self-pay

## 2021-09-01 ENCOUNTER — Encounter: Payer: Self-pay | Admitting: Family Medicine

## 2021-09-01 DIAGNOSIS — E538 Deficiency of other specified B group vitamins: Secondary | ICD-10-CM

## 2021-09-01 DIAGNOSIS — D649 Anemia, unspecified: Secondary | ICD-10-CM

## 2021-09-01 LAB — PATHOLOGIST SMEAR REVIEW

## 2021-09-01 NOTE — Telephone Encounter (Signed)
No further action needed.

## 2021-09-01 NOTE — Telephone Encounter (Signed)
No action needed

## 2021-09-01 NOTE — Telephone Encounter (Signed)
Please see other mychart encounter. Pt advised to return call regarding results/recommendations.

## 2021-09-01 NOTE — Telephone Encounter (Signed)
-----   Message from Tammi Sou, MD sent at 09/01/2021  8:06 AM EST ----- Vitamin B12 is low.  This is making his red blood cell count just a little bit low--called anemia. I recommend he start taking over-the-counter B12 1000 mcg sublingual tablet every day. The remainder of his labs were normal--of note his cholesterol levels are just fine for his age and level of cardiac risk. Nonfasting lab appointment in 3 months to recheck CBC and vitamin B12 level, diagnosis normocytic anemia and vitamin B12 deficiency.

## 2021-09-18 ENCOUNTER — Ambulatory Visit (AMBULATORY_SURGERY_CENTER): Payer: BC Managed Care – PPO | Admitting: *Deleted

## 2021-09-18 ENCOUNTER — Other Ambulatory Visit: Payer: Self-pay

## 2021-09-18 VITALS — Ht 73.0 in | Wt 192.0 lb

## 2021-09-18 DIAGNOSIS — R195 Other fecal abnormalities: Secondary | ICD-10-CM

## 2021-09-18 MED ORDER — PEG 3350-KCL-NA BICARB-NACL 420 G PO SOLR: 4000.0000 mL | Freq: Once | ORAL | 0 refills | Status: AC

## 2021-09-18 NOTE — Progress Notes (Signed)

## 2021-09-25 ENCOUNTER — Encounter: Payer: Self-pay | Admitting: Gastroenterology

## 2021-09-27 DIAGNOSIS — R195 Other fecal abnormalities: Secondary | ICD-10-CM

## 2021-09-27 HISTORY — DX: Other fecal abnormalities: R19.5

## 2021-10-04 ENCOUNTER — Encounter: Payer: Self-pay | Admitting: Gastroenterology

## 2021-10-04 ENCOUNTER — Other Ambulatory Visit (INDEPENDENT_AMBULATORY_CARE_PROVIDER_SITE_OTHER): Payer: BC Managed Care – PPO

## 2021-10-04 ENCOUNTER — Ambulatory Visit (AMBULATORY_SURGERY_CENTER): Payer: BC Managed Care – PPO | Admitting: Gastroenterology

## 2021-10-04 VITALS — BP 92/71 | HR 69 | Temp 98.4°F | Resp 16 | Ht 73.5 in | Wt 192.0 lb

## 2021-10-04 DIAGNOSIS — R195 Other fecal abnormalities: Secondary | ICD-10-CM | POA: Diagnosis not present

## 2021-10-04 DIAGNOSIS — K621 Rectal polyp: Secondary | ICD-10-CM

## 2021-10-04 DIAGNOSIS — K648 Other hemorrhoids: Secondary | ICD-10-CM

## 2021-10-04 DIAGNOSIS — D649 Anemia, unspecified: Secondary | ICD-10-CM

## 2021-10-04 DIAGNOSIS — K635 Polyp of colon: Secondary | ICD-10-CM

## 2021-10-04 DIAGNOSIS — D128 Benign neoplasm of rectum: Secondary | ICD-10-CM

## 2021-10-04 DIAGNOSIS — D120 Benign neoplasm of cecum: Secondary | ICD-10-CM

## 2021-10-04 DIAGNOSIS — D125 Benign neoplasm of sigmoid colon: Secondary | ICD-10-CM

## 2021-10-04 LAB — CBC
HCT: 39.1 % (ref 39.0–52.0)
Hemoglobin: 13.2 g/dL (ref 13.0–17.0)
MCHC: 33.6 g/dL (ref 30.0–36.0)
MCV: 90.6 fl (ref 78.0–100.0)
Platelets: 277 10*3/uL (ref 150.0–400.0)
RBC: 4.32 Mil/uL (ref 4.22–5.81)
RDW: 13 % (ref 11.5–15.5)
WBC: 3.8 10*3/uL — ABNORMAL LOW (ref 4.0–10.5)

## 2021-10-04 LAB — IBC + FERRITIN
Ferritin: 223.1 ng/mL (ref 22.0–322.0)
Iron: 91 ug/dL (ref 42–165)
Saturation Ratios: 26 % (ref 20.0–50.0)
TIBC: 350 ug/dL (ref 250.0–450.0)
Transferrin: 250 mg/dL (ref 212.0–360.0)

## 2021-10-04 LAB — FOLATE: Folate: 17 ng/mL (ref >5.9)

## 2021-10-04 LAB — VITAMIN B12: Vitamin B-12: 821 pg/mL (ref 211–911)

## 2021-10-04 MED ORDER — SODIUM CHLORIDE 0.9 % IV SOLN: 500.0000 mL | Freq: Once | INTRAVENOUS | Status: DC

## 2021-10-04 NOTE — Progress Notes (Signed)
Called to room to assist during endoscopic procedure.  Patient ID and intended procedure confirmed with present staff. Received instructions for my participation in the procedure from the performing physician.  

## 2021-10-04 NOTE — Progress Notes (Signed)
VS  DT ? ?Pt's states no medical or surgical changes since previsit or office visit. ? ?

## 2021-10-04 NOTE — Progress Notes (Signed)
c ? ?GASTROENTEROLOGY PROCEDURE H&P NOTE  ? ?Primary Care Physician: ?McGowen, Adrian Blackwater, MD ? ?HPI: ?Andrew Cantu is a 56 y.o. male who presents for Colonoscopy for Screening/Positive Cologuard. ? ?Past Medical History:  ?Diagnosis Date  ? Anxiety   ? particularly about his health  ? Borderline hypercholesterolemia 08/2020  ? 2022 frmhm 46yrcv risk=5%-->TLC  ? Discomfort in chest 2018/2019  ? R side: pt says he feels like there is something in his R lung.  He cannot be reassured that he is ok despite normal ESR, spirometry, and CXR-->therefore, Pulmonologit ordered CT chest 11/2017 but pt did not get this.  Dr. WMelvyn Novas9/2020->HRCT planned.  Upper airway cough syndrome suspected.  ? Elevated serum creatinine   ? Cr 1.8 (eGFR 43).  Normal on f/u testing.  ? Kidney stone 05/2017  ? Asymptomatic 8 mm nonobstructing stone in midportion of R kidney (stone vs small angiomyolipoma).  Plan--active surveillance with repeat renal u/s around 11/2017.  ? Normocytic anemia 08/2021  ? Very mild, B12 low at the same time--we will follow to see if this normalizes as B12 comes up (iron level normal)  ? Upper airway cough syndrome 2021  ? Dr. WMelvyn Novas  Also, CT showed some changes c/w "centrilobular COPD"  ? Vitamin B12 deficiency 08/2021  ? Oral B12 recommended  ? ?Past Surgical History:  ?Procedure Laterality Date  ? COLONOSCOPY  age 89 ? normal (done for heme + stool -->conclusion was due to oral/dental bleeding  ? ?Current Outpatient Medications  ?Medication Sig Dispense Refill  ? vitamin B-12 (CYANOCOBALAMIN) 500 MCG tablet Take 500 mcg by mouth daily.    ? ?Current Facility-Administered Medications  ?Medication Dose Route Frequency Provider Last Rate Last Admin  ? 0.9 %  sodium chloride infusion  500 mL Intravenous Once Mansouraty, GTelford Nab, MD      ? ? ?Current Outpatient Medications:  ?  vitamin B-12 (CYANOCOBALAMIN) 500 MCG tablet, Take 500 mcg by mouth daily., Disp: , Rfl:  ? ?Current Facility-Administered Medications:  ?  0.9  %  sodium chloride infusion, 500 mL, Intravenous, Once, Mansouraty, GTelford Nab, MD ?No Known Allergies ?Family History  ?Problem Relation Age of Onset  ? Colon polyps Father   ? Hypertension Father   ? Colon cancer Neg Hx   ? Esophageal cancer Neg Hx   ? Stomach cancer Neg Hx   ? Rectal cancer Neg Hx   ? ?Social History  ? ?Socioeconomic History  ? Marital status: Married  ?  Spouse name: Not on file  ? Number of children: Not on file  ? Years of education: Not on file  ? Highest education level: Not on file  ?Occupational History  ? Not on file  ?Tobacco Use  ? Smoking status: Former  ?  Packs/day: 0.50  ?  Years: 30.00  ?  Pack years: 15.00  ?  Types: Cigarettes  ?  Quit date: 02/27/2017  ?  Years since quitting: 4.6  ? Smokeless tobacco: Never  ?Vaping Use  ? Vaping Use: Never used  ?Substance and Sexual Activity  ? Alcohol use: Yes  ?  Alcohol/week: 3.0 - 4.0 standard drinks  ?  Types: 3 - 4 Glasses of wine per week  ? Drug use: No  ? Sexual activity: Not on file  ?Other Topics Concern  ? Not on file  ?Social History Narrative  ? MShanon Rosserdaughter (adult).  ? Educ: Masters degree (BA--[employer] and also in EEngineer, production.  ? 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  ? Tob: 30 pack year hx.  Quit 2018.  ? Alc: occ wine  ? Orig from [place].  ? ?Social Determinants of Health  ? ?Financial Resource Strain: Not on file  ?Food Insecurity: Not on file  ?Transportation Needs: Not on file  ?Physical Activity: Not on file  ?Stress: Not on file  ?Social Connections: Not on file  ?Intimate Partner Violence: Not on file  ? ? ?Physical Exam: ?Today's Vitals  ? 10/04/21 5456  ?BP: 139/85  ?Pulse: 63  ?Temp: 98.4 ?F (36.9 ?C)  ?SpO2: 99%  ?Weight: 192 lb (87.1 kg)  ?Height: 6' 1.5" (1.867 m)  ? ?Body mass index is 24.99 kg/m?. ?GEN: NAD ?EYE: Sclerae anicteric ?ENT: MMM ?CV: Non-tachycardic ?GI: Soft, NT/ND ?NEURO:  Alert & Oriented x 3 ? ?Lab Results: ?No results for input(s): WBC, HGB, HCT, PLT in the last 72  hours. ?BMET ?No results for input(s): NA, K, CL, CO2, GLUCOSE, BUN, CREATININE, CALCIUM in the last 72 hours. ?LFT ?No results for input(s): PROT, ALBUMIN, AST, ALT, ALKPHOS, BILITOT, BILIDIR, IBILI in the last 72 hours. ?PT/INR ?No results for input(s): LABPROT, INR in the last 72 hours. ? ? ?Impression / Plan: ?This is a 56 y.o.male who presents for Colonoscopy for Screening/Positive Cologuard. ? ?The risks and benefits of endoscopic evaluation/treatment were discussed with the patient and/or family; these include but are not limited to the risk of perforation, infection, bleeding, missed lesions, lack of diagnosis, severe illness requiring hospitalization, as well as anesthesia and sedation related illnesses.  The patient's history has been reviewed, patient examined, no change in status, and deemed stable for procedure.  The patient and/or family is agreeable to proceed.  ? ? ?Justice Britain, MD ?[facility] ?Advanced Endoscopy ?Office # [phone] ? ?

## 2021-10-04 NOTE — Patient Instructions (Signed)
YOU HAD AN ENDOSCOPIC PROCEDURE TODAY AT THE [facility]:   Refer to the procedure report that was given to you for any specific questions about what was found during the examination.  If the procedure report does not answer your questions, please call your gastroenterologist to clarify.  If you requested that your care partner not be given the details of your procedure findings, then the procedure report has been included in a sealed envelope for you to review at your convenience later.  YOU SHOULD EXPECT: Some feelings of bloating in the abdomen. Passage of more gas than usual.  Walking can help get rid of the air that was put into your GI tract during the procedure and reduce the bloating. If you had a lower endoscopy (such as a colonoscopy or flexible sigmoidoscopy) you may notice spotting of blood in your stool or on the toilet paper. If you underwent a bowel prep for your procedure, you may not have a normal bowel movement for a few days.  Please Note:  You might notice some irritation and congestion in your nose or some drainage.  This is from the oxygen used during your procedure.  There is no need for concern and it should clear up in a day or so.  SYMPTOMS TO REPORT IMMEDIATELY:   Following lower endoscopy (colonoscopy or flexible sigmoidoscopy):  Excessive amounts of blood in the stool  Significant tenderness or worsening of abdominal pains  Swelling of the abdomen that is new, acute  Fever of 100F or higher  For urgent or emergent issues, a gastroenterologist can be reached at any hour by calling [phone]. Do not use MyChart messaging for urgent concerns.    DIET:  We do recommend a small meal at first, but then you may proceed to your regular diet.  Drink plenty of fluids but you should avoid alcoholic beverages for 24 hours.  ACTIVITY:  You should plan to take it easy for the rest of today and you should NOT DRIVE or use heavy machinery until tomorrow (because  of the sedation medicines used during the test).    FOLLOW UP: Our staff will call the number listed on your records 48-72 hours following your procedure to check on you and address any questions or concerns that you may have regarding the information given to you following your procedure. If we do not reach you, we will leave a message.  We will attempt to reach you two times.  During this call, we will ask if you have developed any symptoms of COVID 19. If you develop any symptoms (ie: fever, flu-like symptoms, shortness of breath, cough etc.) before then, please call [phone].  If you test positive for Covid 19 in the 2 weeks post procedure, please call and report this information to us.    If any biopsies were taken you will be contacted by phone or by letter within the next 1-3 weeks.  Please call us at [phone] if you have not heard about the biopsies in 3 weeks.    SIGNATURES/CONFIDENTIALITY: You and/or your care partner have signed paperwork which will be entered into your electronic medical record.  These signatures attest to the fact that that the information above on your After Visit Summary has been reviewed and is understood.  Full responsibility of the confidentiality of this discharge information lies with you and/or your care-partner. 

## 2021-10-04 NOTE — Progress Notes (Signed)
Report to PACU, RN, vss, BBS= Clear.  

## 2021-10-04 NOTE — Op Note (Signed)
[place] ?Patient Name: Andrew Cantu ?Procedure Date: 10/04/2021 10:08 AM ?MRN: [id number] ?Endoscopist: Justice Britain , MD ?Age: 56 ?Referring MD:  ?Date of Birth: July 12, 1966 ?Gender: Male ?Account #: [id number] ?Procedure:                Colonoscopy ?Indications:              Positive Cologuard test, This is the patient's  ?                          first colonoscopy ?Medicines:                Monitored Anesthesia Care ?Procedure:                Pre-Anesthesia Assessment: ?                          - Prior to the procedure, a History and Physical  ?                          was performed, and patient medications and  ?                          allergies were reviewed. The patient's tolerance of  ?                          previous anesthesia was also reviewed. The risks  ?                          and benefits of the procedure and the sedation  ?                          options and risks were discussed with the patient.  ?                          All questions were answered, and informed consent  ?                          was obtained. Prior Anticoagulants: The patient has  ?                          taken no previous anticoagulant or antiplatelet  ?                          agents. ASA Grade Assessment: II - A patient with  ?                          mild systemic disease. After reviewing the risks  ?                          and benefits, the patient was deemed in  ?                          satisfactory condition to undergo the procedure. ?  After obtaining informed consent, the colonoscope  ?                          was passed under direct vision. Throughout the  ?                          procedure, the patient's blood pressure, pulse, and  ?                          oxygen saturations were monitored continuously. The  ?                          Olympus CF-[id number] (#[id number]) Colonoscope was  ?                          introduced through the anus and advanced to the 5   ?                          cm into the ileum. The colonoscopy was performed  ?                          without difficulty. The patient tolerated the  ?                          procedure. The quality of the bowel preparation was  ?                          good. The terminal ileum, ileocecal valve,  ?                          appendiceal orifice, and rectum were photographed. ?Scope In: 10:14:00 AM ?Scope Out: 10:31:56 AM ?Scope Withdrawal Time: 0 hours 15 minutes 3 seconds  ?Total Procedure Duration: 0 hours 17 minutes 56 seconds  ?Findings:                 The digital rectal exam findings include  ?                          hemorrhoids. Pertinent negatives include no  ?                          palpable rectal lesions. ?                          The terminal ileum and ileocecal valve appeared  ?                          normal. ?                          Four sessile polyps were found in the rectum (2),  ?                          sigmoid colon (1) and cecum (1). The polyps were 2  ?  to 5 mm in size. These polyps were removed with a  ?                          cold snare. Resection and retrieval were complete. ?                          Normal mucosa was found in the entire colon  ?                          otherwise. ?                          Non-bleeding non-thrombosed internal hemorrhoids  ?                          were found during retroflexion, during perianal  ?                          exam and during digital exam. The hemorrhoids were  ?                          Grade II (internal hemorrhoids that prolapse but  ?                          reduce spontaneously). ?Complications:            No immediate complications. ?Estimated Blood Loss:     Estimated blood loss was minimal. ?Impression:               - Hemorrhoids found on digital rectal exam. ?                          - The examined portion of the ileum was normal. ?                          - Four 2 to 5 mm polyps in the  rectum, in the  ?                          sigmoid colon and in the cecum, removed with a cold  ?                          snare. Resected and retrieved. ?                          - Normal mucosa in the entire examined colon  ?                          otherwise. ?                          - Non-bleeding non-thrombosed internal hemorrhoids. ?Recommendation:           - The patient will be observed post-procedure,  ?                          until all discharge  criteria are met. ?                          - Discharge patient to home. ?                          - Patient has a contact number available for  ?                          emergencies. The signs and symptoms of potential  ?                          delayed complications were discussed with the  ?                          patient. Return to normal activities tomorrow.  ?                          Written discharge instructions were provided to the  ?                          patient. ?                          - High fiber diet. ?                          - Use FiberCon 1-2 tablets PO daily. ?                          - Continue present medications. ?                          - Await pathology results. ?                          - Repeat colonoscopy for surveillance based on  ?                          pathology results. ?                          - Recommend that you complete the CBC and Iron  ?                          studies that were ordered so that we can ensure  ?                          that you do not have evidence of Iron deficiency.  ?                          If we find Iron deficiency, then we will recommend  ?                          an EGD and possible VCE if nothing is found. If you  ?  do not have Iron deficiency, then further  ?                          workup/evaluation may be recommended by Hematology. ?                          - The findings and recommendations were discussed  ?                          with the  patient. ?                          - The findings and recommendations were discussed  ?                          with the patient's family. ?Justice Britain, MD ?10/04/2021 10:38:49 AM ?

## 2021-10-06 ENCOUNTER — Encounter: Payer: Self-pay | Admitting: Gastroenterology

## 2021-10-06 ENCOUNTER — Telehealth: Payer: Self-pay

## 2021-10-06 NOTE — Telephone Encounter (Signed)
?  Follow up Call- ? ?Call back number 10/04/2021  ?Post procedure Call Back phone  # [phone]  ?Permission to leave phone message Yes  ?Some recent data might be hidden  ?  ? ?Patient questions: ? ?Do you have a fever, pain , or abdominal swelling? No. ?Pain Score  0 * ? ?Have you tolerated food without any problems? Yes.   ? ?Have you been able to return to your normal activities? Yes.   ? ?Do you have any questions about your discharge instructions: ?Diet   No. ?Medications  No. ?Follow up visit  No. ? ?Do you have questions or concerns about your Care? No. ? ?Actions: ?* If pain score is 4 or above: ?No action needed, pain <4. ? ? ?

## 2021-11-29 ENCOUNTER — Ambulatory Visit (INDEPENDENT_AMBULATORY_CARE_PROVIDER_SITE_OTHER): Payer: BC Managed Care – PPO

## 2021-11-29 DIAGNOSIS — E538 Deficiency of other specified B group vitamins: Secondary | ICD-10-CM

## 2021-11-29 DIAGNOSIS — D649 Anemia, unspecified: Secondary | ICD-10-CM

## 2021-11-29 DIAGNOSIS — D72819 Decreased white blood cell count, unspecified: Secondary | ICD-10-CM

## 2021-11-29 LAB — IBC + FERRITIN
Ferritin: 181.7 ng/mL (ref 22.0–322.0)
Iron: 109 ug/dL (ref 42–165)
Saturation Ratios: 30.4 % (ref 20.0–50.0)
TIBC: 358.4 ug/dL (ref 250.0–450.0)
Transferrin: 256 mg/dL (ref 212.0–360.0)

## 2021-11-29 LAB — CBC WITH DIFFERENTIAL/PLATELET
Basophils Absolute: 0.1 10*3/uL (ref 0.0–0.1)
Basophils Relative: 3.3 % — ABNORMAL HIGH (ref 0.0–3.0)
Eosinophils Absolute: 0.1 10*3/uL (ref 0.0–0.7)
Eosinophils Relative: 1.7 % (ref 0.0–5.0)
HCT: 41.2 % (ref 39.0–52.0)
Hemoglobin: 13.6 g/dL (ref 13.0–17.0)
Lymphocytes Relative: 31.6 % (ref 12.0–46.0)
Lymphs Abs: 1.1 10*3/uL (ref 0.7–4.0)
MCHC: 33.1 g/dL (ref 30.0–36.0)
MCV: 91.4 fl (ref 78.0–100.0)
Monocytes Absolute: 0.4 10*3/uL (ref 0.1–1.0)
Monocytes Relative: 11.1 % (ref 3.0–12.0)
Neutro Abs: 1.8 10*3/uL (ref 1.4–7.7)
Neutrophils Relative %: 52.3 % (ref 43.0–77.0)
Platelets: 286 10*3/uL (ref 150.0–400.0)
RBC: 4.51 Mil/uL (ref 4.22–5.81)
RDW: 13 % (ref 11.5–15.5)
WBC: 3.5 10*3/uL — ABNORMAL LOW (ref 4.0–10.5)

## 2021-11-30 ENCOUNTER — Other Ambulatory Visit (INDEPENDENT_AMBULATORY_CARE_PROVIDER_SITE_OTHER): Payer: BC Managed Care – PPO

## 2021-11-30 DIAGNOSIS — E538 Deficiency of other specified B group vitamins: Secondary | ICD-10-CM | POA: Diagnosis not present

## 2021-11-30 LAB — VITAMIN B12: Vitamin B-12: 1017 pg/mL — ABNORMAL HIGH (ref 211–911)

## 2021-12-04 NOTE — Addendum Note (Signed)
Addended by: Kavin Leech on: 12/04/2021 09:49 AM ? ? Modules accepted: Orders ? ?

## 2021-12-05 ENCOUNTER — Telehealth: Payer: Self-pay

## 2021-12-05 NOTE — Telephone Encounter (Signed)
LaTonya calling from [facility] regarding referral they received.  [phone] ? ?They are unable to see this patient as patient is 56 years old, and they see only see pediatric patients. ? ?Please change referral location. ?

## 2021-12-06 ENCOUNTER — Telehealth: Payer: Self-pay | Admitting: Hematology and Oncology

## 2021-12-06 NOTE — Telephone Encounter (Signed)
Scheduled appt per 5/8 referral. Pt is aware of appt date and time. Pt is aware to arrive 15 mins prior to appt time and to bring and updated insurance card. Pt is aware of appt location.   ?

## 2021-12-28 ENCOUNTER — Inpatient Hospital Stay: Payer: BC Managed Care – PPO | Admitting: Hematology and Oncology

## 2022-12-06 NOTE — Patient Instructions (Signed)
   It was very nice to see you today!   PLEASE NOTE:   If you had any lab tests please let us know if you have not heard back within a few days. You may see your results on MyChart before we have a chance to review them but we will give you a call once they are reviewed by [place]. If we ordered any referrals today, please let us know if you have not heard from their office within the next 2 weeks. You should receive a letter via MyChart confirming if the referral was approved and their office contact information to schedule.  Please try these tips to maintain a healthy lifestyle:  Eat most of your calories during the day when you are active. Eliminate processed foods including packaged sweets (pies, cakes, cookies), reduce intake of potatoes, white bread, white pasta, and white rice. Look for whole grain options, oat flour or almond flour.  Each meal should contain half fruits/vegetables, one quarter protein, and one quarter carbs (no bigger than a computer mouse).  Cut down on sweet beverages. This includes juice, soda, and sweet tea. Also watch fruit intake, though this is a healthier sweet option, it still contains natural sugar! Limit to 3 servings daily.  Drink at least 1 glass of water with each meal and aim for at least 8 glasses per day  Exercise at least 150 minutes every week.

## 2022-12-10 ENCOUNTER — Encounter: Payer: Self-pay | Admitting: Family Medicine

## 2022-12-10 ENCOUNTER — Ambulatory Visit: Payer: 59 | Admitting: Family Medicine

## 2022-12-10 VITALS — BP 123/80 | HR 62 | Temp 97.9°F | Ht 73.0 in | Wt 191.0 lb

## 2022-12-10 DIAGNOSIS — Z23 Encounter for immunization: Secondary | ICD-10-CM | POA: Diagnosis not present

## 2022-12-10 DIAGNOSIS — Z125 Encounter for screening for malignant neoplasm of prostate: Secondary | ICD-10-CM | POA: Diagnosis not present

## 2022-12-10 DIAGNOSIS — Z0000 Encounter for general adult medical examination without abnormal findings: Secondary | ICD-10-CM | POA: Diagnosis not present

## 2022-12-10 DIAGNOSIS — Z13220 Encounter for screening for lipoid disorders: Secondary | ICD-10-CM | POA: Diagnosis not present

## 2022-12-10 LAB — LIPID PANEL
Cholesterol: 221 mg/dL — ABNORMAL HIGH (ref 0–200)
HDL: 65.2 mg/dL (ref >39.00)
LDL Cholesterol: 144 mg/dL — ABNORMAL HIGH (ref 0–99)
NonHDL: 155.5
Total CHOL/HDL Ratio: 3
Triglycerides: 60 mg/dL (ref 0.0–149.0)
VLDL: 12 mg/dL (ref 0.0–40.0)

## 2022-12-10 LAB — CBC
HCT: 39.2 % (ref 39.0–52.0)
Hemoglobin: 13.1 g/dL (ref 13.0–17.0)
MCHC: 33.5 g/dL (ref 30.0–36.0)
MCV: 89.5 fl (ref 78.0–100.0)
Platelets: 275 10*3/uL (ref 150.0–400.0)
RBC: 4.38 Mil/uL (ref 4.22–5.81)
RDW: 13.5 % (ref 11.5–15.5)
WBC: 3.5 10*3/uL — ABNORMAL LOW (ref 4.0–10.5)

## 2022-12-10 LAB — COMPREHENSIVE METABOLIC PANEL
ALT: 17 U/L (ref 0–53)
AST: 19 U/L (ref 0–37)
Albumin: 4.3 g/dL (ref 3.5–5.2)
Alkaline Phosphatase: 44 U/L (ref 39–117)
BUN: 19 mg/dL (ref 6–23)
CO2: 28 mEq/L (ref 19–32)
Calcium: 9.4 mg/dL (ref 8.4–10.5)
Chloride: 103 mEq/L (ref 96–112)
Creatinine, Ser: 0.97 mg/dL (ref 0.40–1.50)
GFR: 88.45 mL/min (ref >60.00)
Glucose, Bld: 89 mg/dL (ref 70–99)
Potassium: 5 mEq/L (ref 3.5–5.1)
Sodium: 139 mEq/L (ref 135–145)
Total Bilirubin: 0.9 mg/dL (ref 0.2–1.2)
Total Protein: 6.6 g/dL (ref 6.0–8.3)

## 2022-12-10 LAB — PSA: PSA: 0.45 ng/mL (ref 0.10–4.00)

## 2022-12-10 LAB — TSH: TSH: 2.71 u[IU]/mL (ref 0.35–5.50)

## 2022-12-10 NOTE — Progress Notes (Signed)
Office Note 12/10/2022  CC:  Chief Complaint  Patient presents with   Annual Exam    Pt is fasting    HPI:  Patient is a 57 y.o. male who is here for annual health maintenance exam.  Feels well. No acute concerns.  Past Medical History:  Diagnosis Date   Anxiety    particularly about his health   Borderline hypercholesterolemia 08/2020   2022 frmhm 64yr cv risk=5%-->TLC   Discomfort in chest 2018/2019   R side: pt says he feels like there is something in his R lung.  He cannot be reassured that he is ok despite normal ESR, spirometry, and CXR-->therefore, Pulmonologit ordered CT chest 11/2017 but pt did not get this.  Dr. Sherene Sires 03/2019->HRCT planned.  Upper airway cough syndrome suspected.   Elevated serum creatinine    Cr 1.8 (eGFR 43).  Normal on f/u testing.   Kidney stone 05/2017   Asymptomatic 8 mm nonobstructing stone in midportion of R kidney (stone vs small angiomyolipoma).  Plan--active surveillance with repeat renal u/s around 11/2017.   Normocytic anemia 08/2021   Very mild, B12 low at the same time--we will follow to see if this normalizes as B12 comes up (iron level normal)   Positive colorectal cancer screening using Cologuard test 09/2021   09/2021 colonoscopy showed 4 hyperplastic polyps->recall 10 yrs   Upper airway cough syndrome 2021   Dr. Sherene Sires.  Also, CT showed some changes c/w "centrilobular COPD"   Vitamin B12 deficiency 08/2021   Oral B12 recommended    Past Surgical History:  Procedure Laterality Date   COLONOSCOPY  age 66   2011 normal (done for heme + stool -->conclusion was due to oral/dental bleeding.  10/04/2021 (for +cologuard) polyps x 4 --hyperplastic->recall 10 yrs    Family History  Problem Relation Age of Onset   Colon polyps Father    Hypertension Father    Colon cancer Neg Hx    Esophageal cancer Neg Hx    Stomach cancer Neg Hx    Rectal cancer Neg Hx     Social History   Socioeconomic History   Marital status: Married     Spouse name: Not on file   Number of children: Not on file   Years of education: Not on file   Highest education level: Master's degree (e.g., MA, MS, MEng, MEd, MSW, MBA)  Occupational History   Not on file  Tobacco Use   Smoking status: Former    Packs/day: 0.50    Years: 30.00    Additional pack years: 0.00    Total pack years: 15.00    Types: Cigarettes    Quit date: 02/27/2017    Years since quitting: 5.7   Smokeless tobacco: Never  Vaping Use   Vaping Use: Never used  Substance and Sexual Activity   Alcohol use: Yes    Alcohol/week: 3.0 - 4.0 standard drinks of alcohol    Types: 3 - 4 Glasses of wine per week   Drug use: No   Sexual activity: Not on file  Other Topics Concern   Not on file  Social History Narrative   Anabel Bene daughter (adult).   Educ: Masters degree (BA--[employer] and also in Public relations account executive).   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   Tob: 30 pack year hx.  Quit 2018.   Alc: occ wine   Orig from [place].   Social Determinants of Health   Financial Resource Strain: Low Risk  (12/07/2022)   Overall Financial Resource  Strain (CARDIA)    Difficulty of Paying Living Expenses: Not hard at all  Food Insecurity: No Food Insecurity (12/07/2022)   Hunger Vital Sign    Worried About Running Out of Food in the Last Year: Never true    Ran Out of Food in the Last Year: Never true  Transportation Needs: No Transportation Needs (12/07/2022)   PRAPARE - Administrator, Civil Service (Medical): No    Lack of Transportation (Non-Medical): No  Physical Activity: Sufficiently Active (12/07/2022)   Exercise Vital Sign    Days of Exercise per Week: 5 days    Minutes of Exercise per Session: 30 min  Stress: No Stress Concern Present (12/07/2022)   [employer] of Occupational Health - Occupational Stress Questionnaire    Feeling of Stress : Not at all  Social Connections: Unknown (12/07/2022)   Social Connection and Isolation Panel [NHANES]     Frequency of Communication with Friends and Family: More than three times a week    Frequency of Social Gatherings with Friends and Family: Twice a week    Attends Religious Services: Patient declined    Database administrator or Organizations: Patient declined    Attends Engineer, structural: Not on file    Marital Status: Married  Catering manager Violence: Not on file    Outpatient Medications Prior to Visit  Medication Sig Dispense Refill   vitamin B-12 (CYANOCOBALAMIN) 500 MCG tablet Take 500 mcg by mouth daily.     No facility-administered medications prior to visit.    No Known Allergies  Review of Systems  Constitutional:  Negative for appetite change, chills, fatigue and fever.  HENT:  Negative for congestion, dental problem, ear pain and sore throat.   Eyes:  Negative for discharge, redness and visual disturbance.  Respiratory:  Negative for cough, chest tightness, shortness of breath and wheezing.   Cardiovascular:  Negative for chest pain, palpitations and leg swelling.  Gastrointestinal:  Negative for abdominal pain, blood in stool, diarrhea, nausea and vomiting.  Genitourinary:  Negative for difficulty urinating, dysuria, flank pain, frequency, hematuria and urgency.  Musculoskeletal:  Negative for arthralgias, back pain, joint swelling, myalgias and neck stiffness.  Skin:  Negative for pallor and rash.  Neurological:  Negative for dizziness, speech difficulty, weakness and headaches.  Hematological:  Negative for adenopathy. Does not bruise/bleed easily.  Psychiatric/Behavioral:  Negative for confusion and sleep disturbance. The patient is not nervous/anxious.     PE;    12/10/2022    8:05 AM 10/04/2021   10:53 AM 10/04/2021   10:43 AM  Vitals with BMI  Height 6\' 1"     Weight 191 lbs    BMI 25.2    Systolic 123 92 110  Diastolic 80 71 52  Pulse 62 69 65    Gen: Alert, well appearing.  Patient is oriented to person, place, time, and  situation. AFFECT: pleasant, lucid thought and speech. ENT: Ears: EACs clear, normal epithelium.  TMs with good light reflex and landmarks bilaterally.  Eyes: no injection, icteris, swelling, or exudate.  EOMI, PERRLA. Nose: no drainage or turbinate edema/swelling.  No injection or focal lesion.  Mouth: lips without lesion/swelling.  Oral mucosa pink and moist.  Dentition intact and without obvious caries or gingival swelling.  Oropharynx without erythema, exudate, or swelling.  Neck: supple/nontender.  No LAD, mass, or TM.  Carotid pulses 2+ bilaterally, without bruits. CV: RRR, no m/r/g.   LUNGS: CTA bilat, nonlabored resps, good aeration in  all lung fields. ABD: soft, NT, ND, BS normal.  No hepatospenomegaly or mass.  No bruits. EXT: no clubbing, cyanosis, or edema.  Musculoskeletal: no joint swelling, erythema, warmth, or tenderness.  ROM of all joints intact. Skin - no sores or suspicious lesions or rashes or color changes  Pertinent labs:  Lab Results  Component Value Date   TSH 2.13 08/30/2021   Lab Results  Component Value Date   WBC 3.5 (L) 11/29/2021   HGB 13.6 11/29/2021   HCT 41.2 11/29/2021   MCV 91.4 11/29/2021   PLT 286.0 11/29/2021   Lab Results  Component Value Date   CREATININE 0.99 08/30/2021   BUN 12 08/30/2021   NA 137 08/30/2021   K 4.6 08/30/2021   CL 104 08/30/2021   CO2 29 08/30/2021   Lab Results  Component Value Date   ALT 17 08/30/2021   AST 21 08/30/2021   ALKPHOS 50 08/30/2021   BILITOT 0.9 08/30/2021   Lab Results  Component Value Date   CHOL 208 (H) 08/30/2021   Lab Results  Component Value Date   HDL 71.40 08/30/2021   Lab Results  Component Value Date   LDLCALC 121 (H) 08/30/2021   Lab Results  Component Value Date   TRIG 77.0 08/30/2021   Lab Results  Component Value Date   CHOLHDL 3 08/30/2021   Lab Results  Component Value Date   PSA 0.54 08/30/2021   PSA 0.51 09/07/2020   PSA 0.83 04/03/2019   ASSESSMENT AND  PLAN:   Health maintenance exam: Reviewed age and gender appropriate health maintenance issues (prudent diet, regular exercise, health risks of tobacco and excessive alcohol, use of seatbelts, fire alarms in home, use of sunscreen).  Also reviewed age and gender appropriate health screening as well as vaccine recommendations. Vaccines: Tdap given today.  Shingrix->deferred.  O/w UTD. Labs: health panel labs, PSA. Prostate ca screening: PSA ordered. Colon ca screening: recall 2033.  An After Visit Summary was printed and given to the patient.  FOLLOW UP:  Return in about 1 year (around 12/10/2023) for annual CPE (fasting).  Signed:  Santiago Bumpers, MD           12/10/2022

## 2024-02-14 ENCOUNTER — Encounter: Payer: Self-pay | Admitting: Family Medicine

## 2024-02-14 ENCOUNTER — Ambulatory Visit (INDEPENDENT_AMBULATORY_CARE_PROVIDER_SITE_OTHER): Admitting: Family Medicine

## 2024-02-14 VITALS — BP 120/74 | HR 59 | Temp 99.1°F | Ht 74.25 in | Wt 194.6 lb

## 2024-02-14 DIAGNOSIS — Z0000 Encounter for general adult medical examination without abnormal findings: Secondary | ICD-10-CM

## 2024-02-14 DIAGNOSIS — E7800 Pure hypercholesterolemia, unspecified: Secondary | ICD-10-CM

## 2024-02-14 DIAGNOSIS — Z125 Encounter for screening for malignant neoplasm of prostate: Secondary | ICD-10-CM | POA: Diagnosis not present

## 2024-02-14 NOTE — Progress Notes (Signed)
 Office Note 02/14/2024  CC:  Chief Complaint  Patient presents with   Annual Exam    Pt is fasting    HPI:  Patient is a 58 y.o. male who is here for annual health maintenance exam.  Linsey feels well. He works out daily.  He has a healthy diet.  He has no acute concerns.  Past Medical History:  Diagnosis Date   Anxiety    particularly about his health   Borderline hypercholesterolemia 08/2020   2022 frmhm 68yr cv risk=5%-->TLC   Discomfort in chest 2018/2019   R side: pt says he feels like there is something in his R lung.  He cannot be reassured that he is ok despite normal ESR, spirometry, and CXR-->therefore, Pulmonologit ordered CT chest 11/2017 but pt did not get this.  Dr. Darlean 03/2019->HRCT planned.  Upper airway cough syndrome suspected.   Elevated serum creatinine    Cr 1.8 (eGFR 43).  Normal on f/u testing.   Kidney stone 05/2017   Asymptomatic 8 mm nonobstructing stone in midportion of R kidney (stone vs small angiomyolipoma).  Plan--active surveillance with repeat renal u/s around 11/2017.   Normocytic anemia 08/2021   Very mild, B12 low at the same time--we will follow to see if this normalizes as B12 comes up (iron level normal)   Positive colorectal cancer screening using Cologuard test 09/2021   09/2021 colonoscopy showed 4 hyperplastic polyps->recall 10 yrs   Upper airway cough syndrome 2021   Dr. Darlean.  Also, CT showed some changes c/w centrilobular COPD   Vitamin B12 deficiency 08/2021   Oral B12 recommended    Past Surgical History:  Procedure Laterality Date   COLONOSCOPY  age 30   2011 normal (done for heme + stool -->conclusion was due to oral/dental bleeding.  10/04/2021 (for +cologuard) polyps x 4 --hyperplastic->recall 10 yrs    Family History  Problem Relation Age of Onset   Colon polyps Father    Hypertension Father    Colon cancer Neg Hx    Esophageal cancer Neg Hx    Stomach cancer Neg Hx    Rectal cancer Neg Hx     Social History    Socioeconomic History   Marital status: Married    Spouse name: Not on file   Number of children: Not on file   Years of education: Not on file   Highest education level: Master's degree (e.g., MA, MS, MEng, MEd, MSW, MBA)  Occupational History   Not on file  Tobacco Use   Smoking status: Former    Current packs/day: 0.00    Average packs/day: 0.5 packs/day for 30.0 years (15.0 ttl pk-yrs)    Types: Cigarettes    Start date: 02/28/1987    Quit date: 02/27/2017    Years since quitting: 6.9   Smokeless tobacco: Never  Vaping Use   Vaping status: Never Used  Substance and Sexual Activity   Alcohol use: Yes    Alcohol/week: 3.0 - 4.0 standard drinks of alcohol    Types: 3 - 4 Glasses of wine per week   Drug use: No   Sexual activity: Not on file  Other Topics Concern   Not on file  Social History Narrative   Wyona linen daughter (adult).   Educ: Masters degree (BA--[employer] and also in Public relations account executive).   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   Tob: 30 pack year hx.  Quit 2018.   Alc: occ wine   Orig from [place].  Social Drivers of Corporate investment banker Strain: Low Risk  (02/11/2024)   Overall Financial Resource Strain (CARDIA)    Difficulty of Paying Living Expenses: Not hard at all  Food Insecurity: No Food Insecurity (02/11/2024)   Hunger Vital Sign    Worried About Running Out of Food in the Last Year: Never true    Ran Out of Food in the Last Year: Never true  Transportation Needs: Unknown (02/11/2024)   PRAPARE - Administrator, Civil Service (Medical): Not on file    Lack of Transportation (Non-Medical): No  Physical Activity: Insufficiently Active (02/11/2024)   Exercise Vital Sign    Days of Exercise per Week: 4 days    Minutes of Exercise per Session: 20 min  Stress: No Stress Concern Present (02/11/2024)   [employer] of Occupational Health - Occupational Stress Questionnaire    Feeling of Stress: Not at all  Social  Connections: Moderately Integrated (02/11/2024)   Social Connection and Isolation Panel    Frequency of Communication with Friends and Family: More than three times a week    Frequency of Social Gatherings with Friends and Family: Three times a week    Attends Religious Services: More than 4 times per year    Active Member of Clubs or Organizations: No    Attends Banker Meetings: Not on file    Marital Status: Married  Catering manager Violence: Not on file    No outpatient medications prior to visit.   No facility-administered medications prior to visit.    No Known Allergies  Review of Systems  Constitutional:  Negative for appetite change, chills, fatigue and fever.  HENT:  Negative for congestion, dental problem, ear pain and sore throat.   Eyes:  Negative for discharge, redness and visual disturbance.  Respiratory:  Negative for cough, chest tightness, shortness of breath and wheezing.   Cardiovascular:  Negative for chest pain, palpitations and leg swelling.  Gastrointestinal:  Negative for abdominal pain, blood in stool, diarrhea, nausea and vomiting.  Genitourinary:  Negative for difficulty urinating, dysuria, flank pain, frequency, hematuria and urgency.  Musculoskeletal:  Negative for arthralgias, back pain, joint swelling, myalgias and neck stiffness.  Skin:  Negative for pallor and rash.  Neurological:  Negative for dizziness, speech difficulty, weakness and headaches.  Hematological:  Negative for adenopathy. Does not bruise/bleed easily.  Psychiatric/Behavioral:  Negative for confusion and sleep disturbance. The patient is not nervous/anxious.     PE;    02/14/2024    3:05 PM 02/14/2024    2:58 PM 12/10/2022    8:05 AM  Vitals with BMI  Height  6' 2.25 6' 1  Weight  194 lbs 10 oz 191 lbs  BMI  24.82 25.2  Systolic 120 155 876  Diastolic 74 79 80  Pulse  59 62   Gen: Alert, well appearing.  Patient is oriented to person, place, time, and  situation. AFFECT: pleasant, lucid thought and speech. ENT: Ears: EACs clear, normal epithelium.  TMs with good light reflex and landmarks bilaterally.  Eyes: no injection, icteris, swelling, or exudate.  EOMI, PERRLA. Nose: no drainage or turbinate edema/swelling.  No injection or focal lesion.  Mouth: lips without lesion/swelling.  Oral mucosa pink and moist.  Dentition intact and without obvious caries or gingival swelling.  Oropharynx without erythema, exudate, or swelling.  Neck: supple/nontender.  No LAD, mass, or TM.  Carotid pulses 2+ bilaterally, without bruits. CV: RRR, no m/r/g.   LUNGS: CTA  bilat, nonlabored resps, good aeration in all lung fields. ABD: soft, NT, ND, BS normal.  No hepatospenomegaly or mass.  No bruits. EXT: no clubbing, cyanosis, or edema.  Musculoskeletal: no joint swelling, erythema, warmth, or tenderness.  ROM of all joints intact. Skin - no sores or suspicious lesions or rashes or color changes  Pertinent labs:  Lab Results  Component Value Date   TSH 2.71 12/10/2022   Lab Results  Component Value Date   WBC 3.5 (L) 12/10/2022   HGB 13.1 12/10/2022   HCT 39.2 12/10/2022   MCV 89.5 12/10/2022   PLT 275.0 12/10/2022   Lab Results  Component Value Date   CREATININE 0.97 12/10/2022   BUN 19 12/10/2022   NA 139 12/10/2022   K 5.0 12/10/2022   CL 103 12/10/2022   CO2 28 12/10/2022   Lab Results  Component Value Date   ALT 17 12/10/2022   AST 19 12/10/2022   ALKPHOS 44 12/10/2022   BILITOT 0.9 12/10/2022   Lab Results  Component Value Date   CHOL 221 (H) 12/10/2022   Lab Results  Component Value Date   HDL 65.20 12/10/2022   Lab Results  Component Value Date   LDLCALC 144 (H) 12/10/2022   Lab Results  Component Value Date   TRIG 60.0 12/10/2022   Lab Results  Component Value Date   CHOLHDL 3 12/10/2022   Lab Results  Component Value Date   PSA 0.45 12/10/2022   PSA 0.54 08/30/2021   PSA 0.51 09/07/2020   ASSESSMENT AND  PLAN:   Health maintenance exam: Reviewed age and gender appropriate health maintenance issues (prudent diet, regular exercise, health risks of tobacco and excessive alcohol, use of seatbelts, fire alarms in home, use of sunscreen).  Also reviewed age and gender appropriate health screening as well as vaccine recommendations. Vaccines: Prevnar 20-->declined.   Shingrix->deferred.  O/w UTD. Labs: health panel labs, PSA. Prostate ca screening: PSA ordered. Colon ca screening: recall 2033.  Elevated blood pressure without diagnosis of hypertension. His initial blood pressure check today was 155/79. Rechecked manually about 10 minutes later was 120/74. He will start checking blood pressure once a week and let us  know if consistently greater than 130/80.  An After Visit Summary was printed and given to the patient.  FOLLOW UP:  Return in about 1 year (around 02/13/2025) for annual CPE (fasting).  Signed:  Gerlene Hockey, MD           02/14/2024

## 2024-02-15 LAB — CBC WITH DIFFERENTIAL/PLATELET
Absolute Lymphocytes: 1116 {cells}/uL (ref 850–3900)
Absolute Monocytes: 396 {cells}/uL (ref 200–950)
Basophils Absolute: 40 {cells}/uL (ref 0–200)
Basophils Relative: 1.1 %
Eosinophils Absolute: 83 {cells}/uL (ref 15–500)
Eosinophils Relative: 2.3 %
HCT: 41.8 % (ref 38.5–50.0)
Hemoglobin: 13.4 g/dL (ref 13.2–17.1)
MCH: 28.9 pg (ref 27.0–33.0)
MCHC: 32.1 g/dL (ref 32.0–36.0)
MCV: 90.3 fL (ref 80.0–100.0)
MPV: 9.8 fL (ref 7.5–12.5)
Monocytes Relative: 11 %
Neutro Abs: 1966 {cells}/uL (ref 1500–7800)
Neutrophils Relative %: 54.6 %
Platelets: 274 Thousand/uL (ref 140–400)
RBC: 4.63 Million/uL (ref 4.20–5.80)
RDW: 12.6 % (ref 11.0–15.0)
Total Lymphocyte: 31 %
WBC: 3.6 Thousand/uL — ABNORMAL LOW (ref 3.8–10.8)

## 2024-02-15 LAB — COMPREHENSIVE METABOLIC PANEL WITH GFR
AG Ratio: 1.9 (calc) (ref 1.0–2.5)
ALT: 20 U/L (ref 9–46)
AST: 17 U/L (ref 10–35)
Albumin: 4.4 g/dL (ref 3.6–5.1)
Alkaline phosphatase (APISO): 52 U/L (ref 35–144)
BUN: 10 mg/dL (ref 7–25)
CO2: 27 mmol/L (ref 20–32)
Calcium: 9.2 mg/dL (ref 8.6–10.3)
Chloride: 103 mmol/L (ref 98–110)
Creat: 0.92 mg/dL (ref 0.70–1.30)
Globulin: 2.3 g/dL (ref 1.9–3.7)
Glucose, Bld: 93 mg/dL (ref 65–99)
Potassium: 4.2 mmol/L (ref 3.5–5.3)
Sodium: 138 mmol/L (ref 135–146)
Total Bilirubin: 1.2 mg/dL (ref 0.2–1.2)
Total Protein: 6.7 g/dL (ref 6.1–8.1)
eGFR: 98 mL/min/1.73m2 (ref >=60)

## 2024-02-15 LAB — LIPID PANEL
Cholesterol: 225 mg/dL — ABNORMAL HIGH (ref <200)
HDL: 67 mg/dL (ref >=40)
LDL Cholesterol (Calc): 142 mg/dL — ABNORMAL HIGH
Non-HDL Cholesterol (Calc): 158 mg/dL — ABNORMAL HIGH (ref <130)
Total CHOL/HDL Ratio: 3.4 (calc) (ref <5.0)
Triglycerides: 65 mg/dL (ref <150)

## 2024-02-15 LAB — PSA: PSA: 0.5 ng/mL (ref <=4.00)

## 2024-02-17 ENCOUNTER — Ambulatory Visit: Payer: Self-pay | Admitting: Family Medicine
